# Patient Record
Sex: Female | Born: 1965 | Race: White | Hispanic: Yes | Marital: Married | Smoking: Never smoker
Health system: Southern US, Community
[De-identification: ages and names within clinical notes are randomized; demographics above are authoritative.]

## PROBLEM LIST (undated history)

## (undated) ENCOUNTER — Ambulatory Visit: Discharge status: Absent without leave | Payer: BC Managed Care – PPO | Source: Home / Self Care

## (undated) DIAGNOSIS — M1990 Unspecified osteoarthritis, unspecified site: Secondary | ICD-10-CM

## (undated) DIAGNOSIS — G8929 Other chronic pain: Secondary | ICD-10-CM

## (undated) DIAGNOSIS — M549 Dorsalgia, unspecified: Secondary | ICD-10-CM

## (undated) DIAGNOSIS — N920 Excessive and frequent menstruation with regular cycle: Principal | ICD-10-CM

## (undated) HISTORY — DX: Excessive and frequent menstruation with regular cycle: N92.0

## (undated) HISTORY — DX: Other chronic pain: G89.29

## (undated) HISTORY — DX: Unspecified osteoarthritis, unspecified site: M19.90

## (undated) HISTORY — PX: TUBAL LIGATION: SHX77

## (undated) HISTORY — DX: Dorsalgia, unspecified: M54.9

---

## 2000-10-11 ENCOUNTER — Other Ambulatory Visit: Admission: RE | Admit: 2000-10-11 | Discharge: 2000-10-11 | Payer: Self-pay | Admitting: Obstetrics and Gynecology

## 2003-02-13 ENCOUNTER — Ambulatory Visit (HOSPITAL_COMMUNITY): Admission: RE | Admit: 2003-02-13 | Discharge: 2003-02-13 | Payer: Self-pay | Admitting: Preventative Medicine

## 2003-02-13 ENCOUNTER — Encounter: Payer: Self-pay | Admitting: Preventative Medicine

## 2003-07-21 ENCOUNTER — Ambulatory Visit (HOSPITAL_COMMUNITY): Admission: RE | Admit: 2003-07-21 | Discharge: 2003-07-21 | Payer: Self-pay | Admitting: Unknown Physician Specialty

## 2003-10-06 ENCOUNTER — Encounter
Admission: RE | Admit: 2003-10-06 | Discharge: 2003-12-22 | Payer: Self-pay | Admitting: Physical Medicine and Rehabilitation

## 2007-09-03 ENCOUNTER — Other Ambulatory Visit: Admission: RE | Admit: 2007-09-03 | Discharge: 2007-09-03 | Payer: Self-pay | Admitting: Obstetrics and Gynecology

## 2007-09-21 ENCOUNTER — Emergency Department (HOSPITAL_COMMUNITY): Admission: EM | Admit: 2007-09-21 | Discharge: 2007-09-21 | Payer: Self-pay | Admitting: Emergency Medicine

## 2008-03-04 ENCOUNTER — Inpatient Hospital Stay (HOSPITAL_COMMUNITY): Admission: AD | Admit: 2008-03-04 | Discharge: 2008-03-07 | Payer: Self-pay | Admitting: Obstetrics & Gynecology

## 2008-03-04 ENCOUNTER — Ambulatory Visit: Payer: Self-pay | Admitting: Obstetrics & Gynecology

## 2008-03-05 ENCOUNTER — Encounter: Payer: Self-pay | Admitting: Obstetrics & Gynecology

## 2008-03-21 ENCOUNTER — Ambulatory Visit: Payer: Self-pay | Admitting: Family

## 2008-03-21 ENCOUNTER — Inpatient Hospital Stay (HOSPITAL_COMMUNITY): Admission: AD | Admit: 2008-03-21 | Discharge: 2008-03-21 | Payer: Self-pay | Admitting: Obstetrics & Gynecology

## 2008-04-25 DIAGNOSIS — G8929 Other chronic pain: Secondary | ICD-10-CM

## 2008-04-25 HISTORY — DX: Other chronic pain: G89.29

## 2010-05-15 ENCOUNTER — Encounter: Payer: Self-pay | Admitting: Urology

## 2010-09-07 NOTE — Op Note (Signed)
Katherine Riddle, Katherine Riddle                ACCOUNT NO.:  [id number]   MEDICAL RECORD NO.:  [id number]          PATIENT TYPE:  INP   LOCATION:  9102                          FACILITY:  [facility]   PHYSICIAN:  Norton Blizzard, MD    DATE OF BIRTH:  1965-06-27   DATE OF PROCEDURE:  03/05/2008  DATE OF DISCHARGE:                               OPERATIVE REPORT   PREOPERATIVE DIAGNOSES:  1. Intrauterine pregnancy at 41-2/7 weeks' gestation.  2. Failed induction of labor for post dates.  3. Failure of cervical dilatation.  4. Nonreassuring fetal heart tracing.   POSTOPERATIVE DIAGNOSES:  1. Intrauterine pregnancy at 41-2/7 weeks' gestation.  2. Failed induction of labor for post dates.  3. Failure of cervical dilatation.  4. Nonreassuring fetal heart tracing.   PROCEDURES:  Primary low transverse cesarean section and bilateral tubal  ligation in modified Pomeroy fashion via Pfannenstiel incision.   SURGEON:  Norton Blizzard, MD   ANESTHESIA:  Epidural.   INDICATIONS:  The patient is a 45 year old gravida 7, para 6-0-0-6 who  was admitted for induction of labor for post dates.  Her induction  started with a Foley bulb insertion in the office, and when she came to  labor and delivery, she was started on Pitocin.  The patient got to a  cervical dilatation of 6 cm and failed to progress from the cervical  dilatation for over 3 hours despite adequate contractions as evidenced  by an intrauterine pressure catheter.  The patient was also noted to  have repetitive deep variable decelerations going from a baseline of  130s to the 70s and lasting for 30-45 seconds, which did not resolve  with oxygen, position, and the infusion.  Given this scenario, the  patient was counseled regarding the need for cesarean section.  The  risks of bleeding which might require transfusion, infection which might  require antibiotics, injury to surrounding organs which might require  additional procedures and the risk of  hysterectomy in the event of a  life-threatening bleed were discussed with the patient.  The patient  also desires sterilization.  The risk of failure of tubal sterilization  of 5-7 in 1000 cases with an increased risk of ectopic gestation if  pregnancy occurs was also discussed with the patient.  A written  informed consent was obtained.   FINDINGS:  Viable female infant in cephalic presentation, left occiput  posterior position.  Significant caput.  Apgars were 8 and 9.  Weight 7  pounds 3 ounces.  There was intact placental delivery with 3-vessel cord  noted.  Normal uterus, fallopian tubes, and ovaries bilaterally.   SPECIMENS:  Placenta and cord blood, which were sent to L and D.   IV FLUIDS:  1700 mL of lactated Ringer's.   SPECIMEN:  1 L.   URINE OUTPUT:  130 mL.   COMPLICATIONS:  None.   PROCEDURE DETAILS:  The patient received preoperative IV antibiotics  prior to the incision.  When her epidural level was noted to be at a  surgical level, she was placed in a dorsal supine position with a  leftward tilt and prepped and draped in a sterile manner.  A  Pfannenstiel incision was made with a scalpel and carried through to the  underlying layer of fascia.  Of note, the patient was noted to have a  lot of vessels in subcutaneous tissues, which had some bleeding that was  controlled using coagulation.  An incision was made into the fascia and  the fascial incision was extended bilaterally using Mayo scissors.  Kochers were then applied to the superior aspect of the fascial  incision, and the underlying rectus muscles were dissected off bluntly.  A similar process was carried out on the inferior aspect.  The  peritoneum was entered bluntly, and this incision was extended  superiorly and inferiorly with good visualization of bladder and bowel.  Attention was then turned to the lower uterine segment where a bladder  flap was created and a transverse hysterotomy was made with a  scalpel  and extended bilaterally in a blunt fashion.  The infant's head was  encountered and delivered atraumatically.  The mouth and nares were bulb  suctioned, the rest of the infant was delivered and the cord was clamped  and cut.  The infant was handed over to the awaiting neonatologists.  Cord blood was collected according to protocol.  Fundal massage was  administered and placenta delivered intact with 3-vessel cord.  The  patient was noted to have a lot of bleeding from the hysterotomy site,  especially from the middle of the hysterotomy, and so the uterus was  quickly cleared of all clot and debris and the hysterotomy repaired with  0 Monocryl in a running interlocking stitch.  A second layer of the same  suture was used to imbricate and suture.  Overall, good hemostasis was  noted.  Attention was then turned to the patient's left fallopian tube,  which was grabbed in the mid isthmic region with a Babcock clamp and a  hole was made in the underlying clear mesosalpinx using electrocautery.  About a 1 cm segment of tube was isolated and was tied at both ends  using 2-0 chromic gut.  Each end was tied twice and the intervening  segment was cut out.  This process was repeated on the contralateral  side allowing for bilateral tubal sterilization.  Overall, good  hemostasis was noted.  The hysterotomy was reexamined and found to have  a small amount of bleeding at the middle part of the incision and figure-  of-eight stitch was used to obtain further hemostasis.  The pelvic  gutters were cleared of all clot and debris and hemostasis was  reconfirmed on all surfaces.  The fascia was then reapproximated using 0  Vicryl in a running fashion.  The subcutaneous layer was irrigated with  normal saline and a few areas of bleeding were controlled using  coagulation.  The skin was then closed with staples.  The patient  tolerated the procedure well.  Sponge, instrument, and needle counts  were  correct x2.  She was taken to recovery room awake and in stable  condition.      Norton Blizzard, MD  Electronically Signed     UAD/MEDQ  D:  03/05/2008  T:  03/05/2008  Job:  [id number]

## 2010-09-07 NOTE — Discharge Summary (Signed)
Katherine Riddle, Katherine Riddle                ACCOUNT NO.:  [id number]   MEDICAL RECORD NO.:  [id number]          PATIENT TYPE:  INP   LOCATION:  9102                          FACILITY:  [facility]   PHYSICIAN:  Allie Bossier, MD        DATE OF BIRTH:  April 05, 1966   DATE OF ADMISSION:  03/04/2008  DATE OF DISCHARGE:  03/07/2008                               DISCHARGE SUMMARY   DISCHARGE DIAGNOSES:  1. Status post primary low-transverse cesarean section secondary to      nonreassuring fetal heart tones and failure to progress.  2. Single intrauterine pregnancy at 41.[redacted] weeks gestation.  3. Status post bilateral tubal ligation.   PROCEDURE:  Primary low-transverse cesarean section and bilateral tubal  ligation via Pfannenstiel incision, tubal ligation in a modified Pomeroy  fashion.   HOSPITAL COURSE:  A 45 year old G7, P 6-0-0-6 at 41.[redacted] weeks gestation  presented for induction of labor for postdates.  Induction of labor  consisted of Foley bulb, and Pitocin.  The patient dilated to 6 cm,  however, failed to progress after 3 hours of adequate contractions.  Subsequently had repetitive variables with bradycardia to the 70s that  lasted up to 45 seconds.  The patient was consented for cesarean section  during stage I of labor.  The patient went on to cesarean section and  delivered a viable female with Apgars 8 at 1 minute and 9 at 5 minutes.  Three-vessel cord placenta delivered spontaneously.  The patient  tolerated the procedure well.  The patient also had bilateral tubal  ligation during cesarean section as she desired sterility.  Estimated  blood loss was approximately 1000 mL.  The patient claims to breast  feed.  Status post cesarean section, the patient has postoperative  anemia, hemoglobin of 8.9 and hematocrit of 27.3.  The patient was  without headache, shortness of breath, chest pain, dizziness, or  lightheadedness prior to discharge.  Patient was treated with  supplemental iron sulfate for  postpartum anemia.   ADMISSION LABORATORY DATA:  Blood type O positive, antibody negative,  HIV negative, RPR negative, hepatitis B surface antigen negative,  rubella immune, and GBS negative.  Hemoglobin on admission 12.2 and  hematocrit 37.7.   DISCHARGE LABORATORY DATA:  Hemoglobin 8.9 and hematocrit 27.3.   DISCHARGE MEDICATIONS:  1. Percocet 5/325 mg 1 tablet p.o. q.4 h. p.r.n. pain.  2. Motrin 600 mg one p.o. q.6h. p.r.n. pain.  3. Iron sulfate 325 mg 1 p.o. b.i.d.  4. Colace 100 mg 1 p.o. b.i.d. p.r.n. constipation.  5. Prenatal vitamin daily.   DISCHARGE INSTRUCTIONS:  1. The patient is to have pelvic rest x6 weeks, no heavy lifting x6      weeks.  2. The patient is to have regular diet.  3. The patient will follow up with [facility] on Monday March 10, 2008, to have staples removed.   The patient will follow up in 4 weeks after routine postpartum visit.   DISCHARGE CONDITION:  Stable.      Milinda Antis, MD  Electronically Signed  ______________________________  Allie Bossier, MD    KD/MEDQ  D:  03/08/2008  T:  03/09/2008  Job:  [id number]

## 2011-01-25 LAB — CBC
HCT: 27.3 — ABNORMAL LOW
HCT: 37.7
Hemoglobin: 12.2
Hemoglobin: 8.9 — ABNORMAL LOW
MCHC: 32.4
MCV: 85.7
MCV: 87.1
Platelets: 182
Platelets: 236
RBC: 3.13 — ABNORMAL LOW
RBC: 3.55 — ABNORMAL LOW
RBC: 4.4
RDW: 14.9
WBC: 10.6 — ABNORMAL HIGH
WBC: 12.3 — ABNORMAL HIGH
WBC: 14.1 — ABNORMAL HIGH

## 2011-01-25 LAB — RPR: RPR Ser Ql: NONREACTIVE

## 2012-09-27 ENCOUNTER — Encounter: Payer: Self-pay | Admitting: Internal Medicine

## 2012-09-27 ENCOUNTER — Ambulatory Visit (INDEPENDENT_AMBULATORY_CARE_PROVIDER_SITE_OTHER): Payer: BC Managed Care – PPO | Admitting: Internal Medicine

## 2012-09-27 VITALS — BP 104/78 | HR 86 | Temp 98.4°F | Resp 16 | Wt 193.8 lb

## 2012-09-27 DIAGNOSIS — IMO0002 Reserved for concepts with insufficient information to code with codable children: Secondary | ICD-10-CM

## 2012-09-27 DIAGNOSIS — M1710 Unilateral primary osteoarthritis, unspecified knee: Secondary | ICD-10-CM | POA: Insufficient documentation

## 2012-09-27 DIAGNOSIS — M2390 Unspecified internal derangement of unspecified knee: Secondary | ICD-10-CM | POA: Insufficient documentation

## 2012-09-27 MED ORDER — FLAVOCOXID-CIT ZN BISGLCINATE 500-50 MG PO CAPS: 1.0000 | ORAL_CAPSULE | Freq: Two times a day (BID) | ORAL | Status: DC

## 2012-09-27 NOTE — Patient Instructions (Signed)

## 2012-09-27 NOTE — Progress Notes (Signed)
  Subjective:    Patient ID: Katherine Riddle, female    DOB: 12-Jun-1965, 47 y.o.   MRN: [id number]  Arthritis Presents for follow-up visit. She complains of pain. She reports no stiffness, joint swelling or joint warmth. The symptoms have been worsening. Affected locations include the right knee. Her pain is at a severity of 2/10. Pertinent negatives include no diarrhea, dry eyes, dry mouth, dysuria, fatigue, fever, pain at night, pain while resting, rash, Raynaud's syndrome, uveitis or weight loss. Compliance with total regimen is 0-25%. Compliance with medications is 0-25%. Side effects of treatment include GI discomfort.      Review of Systems  Constitutional: Negative.  Negative for fever, weight loss and fatigue.  HENT: Negative.   Eyes: Negative.   Respiratory: Negative.   Cardiovascular: Negative.   Gastrointestinal: Negative.  Negative for diarrhea.  Endocrine: Negative.   Genitourinary: Negative.  Negative for dysuria.  Musculoskeletal: Positive for back pain, arthralgias and arthritis. Negative for myalgias, joint swelling, gait problem and stiffness.  Skin: Negative for rash.  Allergic/Immunologic: Negative.   Neurological: Negative.   Hematological: Negative for adenopathy. Does not bruise/bleed easily.  Psychiatric/Behavioral: Negative.        Objective:   Physical Exam  Vitals reviewed. Constitutional: She is oriented to person, place, and time. She appears well-developed and well-nourished. No distress.  HENT:  Head: Normocephalic and atraumatic.  Mouth/Throat: Oropharynx is clear and moist. No oropharyngeal exudate.  Eyes: Conjunctivae are normal. Right eye exhibits no discharge. Left eye exhibits no discharge. No scleral icterus.  Neck: Normal range of motion. Neck supple. No JVD present. No tracheal deviation present. No thyromegaly present.  Cardiovascular: Normal rate, regular rhythm, normal heart sounds and intact distal pulses.  Exam reveals no gallop and no  friction rub.   No murmur heard. Pulmonary/Chest: Effort normal and breath sounds normal. No stridor. No respiratory distress. She has no wheezes. She has no rales. She exhibits no tenderness.  Abdominal: Soft. Bowel sounds are normal. She exhibits no distension and no mass. There is no tenderness. There is no rebound and no guarding.  Musculoskeletal: Normal range of motion. She exhibits no edema and no tenderness.       Right knee: She exhibits normal range of motion, no swelling, no effusion, no ecchymosis, no deformity, no laceration, no erythema, normal alignment, no LCL laxity, normal patellar mobility and no bony tenderness. Tenderness found. Medial joint line tenderness noted. No lateral joint line, no MCL, no LCL and no patellar tendon tenderness noted.       Left knee: Normal.  Lymphadenopathy:    She has no cervical adenopathy.  Neurological: She is oriented to person, place, and time.  Skin: Skin is warm and dry. No rash noted. She is not diaphoretic. No erythema. No pallor.  Psychiatric: She has a normal mood and affect. Her behavior is normal. Judgment and thought content normal.          Assessment & Plan:

## 2012-09-27 NOTE — Assessment & Plan Note (Signed)
There is some concern about the medial meniscus so I have asked her to see ortho about this

## 2012-09-27 NOTE — Assessment & Plan Note (Signed)
She has tried nsaids with no relief She did not tolerate tramadol She was referred for PT but she refuses to do it I have asked her to try limbrel

## 2012-11-20 ENCOUNTER — Ambulatory Visit: Payer: Self-pay | Admitting: Internal Medicine

## 2013-04-08 ENCOUNTER — Encounter: Payer: Self-pay | Admitting: Adult Health

## 2013-04-08 ENCOUNTER — Other Ambulatory Visit: Payer: Self-pay | Admitting: Adult Health

## 2013-04-08 ENCOUNTER — Ambulatory Visit (INDEPENDENT_AMBULATORY_CARE_PROVIDER_SITE_OTHER): Payer: Self-pay | Admitting: Adult Health

## 2013-04-08 ENCOUNTER — Other Ambulatory Visit (INDEPENDENT_AMBULATORY_CARE_PROVIDER_SITE_OTHER): Payer: Self-pay

## 2013-04-08 VITALS — BP 110/70 | Ht 62.0 in | Wt 189.0 lb

## 2013-04-08 DIAGNOSIS — N920 Excessive and frequent menstruation with regular cycle: Secondary | ICD-10-CM

## 2013-04-08 DIAGNOSIS — N949 Unspecified condition associated with female genital organs and menstrual cycle: Secondary | ICD-10-CM

## 2013-04-08 DIAGNOSIS — N926 Irregular menstruation, unspecified: Secondary | ICD-10-CM

## 2013-04-08 DIAGNOSIS — N939 Abnormal uterine and vaginal bleeding, unspecified: Secondary | ICD-10-CM

## 2013-04-08 HISTORY — DX: Excessive and frequent menstruation with regular cycle: N92.0

## 2013-04-08 MED ORDER — MEGESTROL ACETATE 40 MG PO TABS: ORAL_TABLET | ORAL | Status: DC

## 2013-04-08 MED ORDER — IBUPROFEN 800 MG PO TABS: 800.0000 mg | ORAL_TABLET | Freq: Three times a day (TID) | ORAL | Status: DC | PRN

## 2013-04-08 NOTE — Progress Notes (Signed)
Subjective:     Patient ID: ROCHELL PUETT, female   DOB: 04/14/1966, 47 y.o.   MRN: [id number]  HPI Kattleya is a 47 year old Hispanic female married in complaining of bleeding since mid October and some cramps and aches.Had [place] this am.  Review of Systems See HPI Reviewed past medical,surgical, social and family history. Reviewed medications and allergies.     Objective:   Physical Exam BP 110/70  Ht 5\' 2"  (1.575 m)  Wt 189 lb (85.73 kg)  BMI 34.56 kg/m2  LMP 02/06/2013   reviewed US uterus 11.9 x 8.6 x 6.8 without masses, endometrium 31.1 mm and active bleeding,rt ovary 2.9 x 1.8 x 1.4 cm with paratubal cyst, left ovary 3 x 2.2 x 1.7, discussed with Dr Despina Hidden, will try megace, pt declines exam to day will do at follow up  Assessment:     Menorrhagia AUB    Plan:     Rx megace 40 mg #45 3 x 5 days then 2 x 5 days then 1 daily with 1 refill   Follow up in 4 weeks Rx motrin 800 mg 1 every 8 hours #60 with 1 refill Review handout on menorrhagia

## 2013-04-08 NOTE — Patient Instructions (Signed)
Menorrhagia Dysfunctional uterine bleeding is different from a normal menstrual period. When periods are heavy or there is more bleeding than is usual for you, it is called menorrhagia. It may be caused by hormonal imbalance, or physical, metabolic, or other problems. Examination is necessary in order that your caregiver may treat treatable causes. If this is a continuing problem, a D&C may be needed. That means that the cervix (the opening of the uterus or womb) is dilated (stretched larger) and the lining of the uterus is scraped out. The tissue scraped out is then examined under a microscope by a specialist (pathologist) to make sure there is nothing of concern that needs further or more extensive treatment. HOME CARE INSTRUCTIONS   If medications were prescribed, take exactly as directed. Do not change or switch medications without consulting your caregiver.  Long term heavy bleeding may result in iron deficiency. Your caregiver may have prescribed iron pills. They help replace the iron your body lost from heavy bleeding. Take exactly as directed. Iron may cause constipation. If this becomes a problem, increase the bran, fruits, and roughage in your diet.  Do not take aspirin or medicines that contain aspirin one week before or during your menstrual period. Aspirin may make the bleeding worse.  If you need to change your sanitary pad or tampon more than once every 2 hours, stay in bed and rest as much as possible until the bleeding stops.  Eat well-balanced meals. Eat foods high in iron. Examples are leafy green vegetables, meat, liver, eggs, and whole grain breads and cereals. Do not try to lose weight until the abnormal bleeding has stopped and your blood iron level is back to normal. SEEK MEDICAL CARE IF:   You need to change your sanitary pad or tampon more than once an hour.  You develop nausea (feeling sick to your stomach) and vomiting, dizziness, or diarrhea while you are taking your  medicine.  You have any problems that may be related to the medicine you are taking. SEEK IMMEDIATE MEDICAL CARE IF:   You have a fever.  You develop chills.  You develop severe bleeding or start to pass blood clots.  You feel dizzy or faint. MAKE SURE YOU:   Understand these instructions.  Will watch your condition.  Will get help right away if you are not doing well or get worse. Document Released: 04/11/2005 Document Revised: 07/04/2011 Document Reviewed: 09/30/2012 [facility] Patient Information 2014 [place], [place]. Take megace Follow up in 4 weeks

## 2013-05-06 ENCOUNTER — Ambulatory Visit: Payer: Self-pay | Admitting: Adult Health

## 2013-06-13 ENCOUNTER — Telehealth: Payer: Self-pay | Admitting: Adult Health

## 2013-06-13 NOTE — Telephone Encounter (Signed)
Her daughter Corrie DandyMary called to talk for [place], took megace and bleeding better went to [place] and they told her needed to have surgery, told pt to make appt with Dr Emelda Fearferguson for endo biopsy may not need surgery.

## 2014-02-24 ENCOUNTER — Encounter: Payer: Self-pay | Admitting: Adult Health

## 2014-11-05 ENCOUNTER — Ambulatory Visit (HOSPITAL_COMMUNITY)
Admission: RE | Admit: 2014-11-05 | Discharge: 2014-11-05 | Disposition: A | Discharge status: Home / Self Care | Payer: BLUE CROSS/BLUE SHIELD | Source: Ambulatory Visit | Attending: Family Medicine | Admitting: Family Medicine

## 2014-11-05 ENCOUNTER — Other Ambulatory Visit (HOSPITAL_COMMUNITY): Payer: Self-pay | Admitting: Family Medicine

## 2014-11-05 DIAGNOSIS — M542 Cervicalgia: Secondary | ICD-10-CM | POA: Insufficient documentation

## 2014-11-05 DIAGNOSIS — M25512 Pain in left shoulder: Secondary | ICD-10-CM

## 2014-11-05 DIAGNOSIS — R200 Anesthesia of skin: Secondary | ICD-10-CM | POA: Diagnosis not present

## 2014-11-05 DIAGNOSIS — R531 Weakness: Secondary | ICD-10-CM | POA: Diagnosis not present

## 2017-02-26 ENCOUNTER — Encounter (HOSPITAL_COMMUNITY): Payer: Self-pay

## 2017-02-26 ENCOUNTER — Emergency Department (HOSPITAL_COMMUNITY)
Admission: EM | Admit: 2017-02-26 | Discharge: 2017-02-26 | Disposition: A | Discharge status: Home Health | Payer: BLUE CROSS/BLUE SHIELD | Attending: Emergency Medicine | Admitting: Emergency Medicine

## 2017-02-26 ENCOUNTER — Other Ambulatory Visit: Payer: Self-pay

## 2017-02-26 DIAGNOSIS — J020 Streptococcal pharyngitis: Secondary | ICD-10-CM | POA: Diagnosis not present

## 2017-02-26 DIAGNOSIS — Z79899 Other long term (current) drug therapy: Secondary | ICD-10-CM | POA: Insufficient documentation

## 2017-02-26 DIAGNOSIS — R000 Tachycardia, unspecified: Secondary | ICD-10-CM | POA: Insufficient documentation

## 2017-02-26 DIAGNOSIS — J029 Acute pharyngitis, unspecified: Secondary | ICD-10-CM | POA: Diagnosis not present

## 2017-02-26 DIAGNOSIS — R1110 Vomiting, unspecified: Secondary | ICD-10-CM | POA: Diagnosis present

## 2017-02-26 MED ORDER — HYDROCODONE-ACETAMINOPHEN 5-325 MG PO TABS: 1.0000 | ORAL_TABLET | ORAL | 0 refills | Status: DC | PRN

## 2017-02-26 MED ORDER — ACETAMINOPHEN 325 MG PO TABS
650.0000 mg | ORAL_TABLET | Freq: Once | ORAL | Status: AC
  Administered 2017-02-26: 650 mg via ORAL
  Filled 2017-02-26: qty 2

## 2017-02-26 MED ORDER — HYDROCODONE-ACETAMINOPHEN 5-325 MG PO TABS: 2.0000 | ORAL_TABLET | ORAL | 0 refills | Status: DC | PRN

## 2017-02-26 MED ORDER — SODIUM CHLORIDE 0.9 % IV BOLUS (SEPSIS)
1000.0000 mL | Freq: Once | INTRAVENOUS | Status: AC
  Administered 2017-02-26: 1000 mL via INTRAVENOUS

## 2017-02-26 MED ORDER — PREDNISONE 20 MG PO TABS: 20.0000 mg | ORAL_TABLET | Freq: Two times a day (BID) | ORAL | 0 refills | Status: DC

## 2017-02-26 MED ORDER — PENICILLIN G BENZATHINE 1200000 UNIT/2ML IM SUSP
1.2000 10*6.[IU] | Freq: Once | INTRAMUSCULAR | Status: AC
  Administered 2017-02-26: 1.2 10*6.[IU] via INTRAMUSCULAR
  Filled 2017-02-26: qty 2

## 2017-02-26 MED ORDER — KETOROLAC TROMETHAMINE 30 MG/ML IJ SOLN
30.0000 mg | Freq: Once | INTRAMUSCULAR | Status: AC
  Administered 2017-02-26: 30 mg via INTRAVENOUS
  Filled 2017-02-26: qty 1

## 2017-02-26 MED ORDER — DEXAMETHASONE SODIUM PHOSPHATE 10 MG/ML IJ SOLN
10.0000 mg | Freq: Once | INTRAMUSCULAR | Status: AC
  Administered 2017-02-26: 10 mg via INTRAVENOUS
  Filled 2017-02-26: qty 1

## 2017-02-26 NOTE — ED Triage Notes (Signed)
Was seen at PCP yesterday for strep throat. States she was given Tramadol for pain and she is allergic to medication. Reports of vomiting after taking medication.

## 2017-02-26 NOTE — Discharge Instructions (Signed)
Prednisone steroid for next 2 days for pain and swelling. Vicodin for pain. You do not need to take amoxicillin. You were given a shot of penicillin today that will last for 10 days. Return to ER with any worsening difficulty breathing, swallowing, or other symptoms.

## 2017-02-26 NOTE — ED Provider Notes (Signed)
[facility] EMERGENCY DEPARTMENT Provider Note   CSN: [id number] Arrival date & time: 02/26/17  1240     History   Chief Complaint Chief Complaint  Patient presents with  . Emesis    HPI Katherine Riddle is a 51 y.o. female. Chief complaint is sore throat, vomiting after medicines, high heart rate  HPI:  51 y/o female. Seen and diagnosed yesterday with strep throat. Given amoxicillin, tramadol, and discharge. 2 tramadol today. Start having nausea and vomiting. She did not know the tramadol and Ultram with the same, and thus did not realize she was taking a medicine she was known to be allergic to. Her side effect is vomiting. She presents here with a temperature of 100.5. Complaining of sore throat. She can swallow though it is painful. An sensation of her heart "beating fast".  Past Medical History:  Diagnosis Date  . Arthritis   . Chronic back pain 2010  . Menorrhagia 04/08/2013    Patient Active Problem List   Diagnosis Date Noted  . Menorrhagia 04/08/2013  . Unspecified internal derangement of knee 09/27/2012  . DJD (degenerative joint disease) of knee 09/27/2012    Past Surgical History:  Procedure Laterality Date  . CESAREAN SECTION    . TUBAL LIGATION      OB History    Gravida Para Term Preterm AB Living   7 7       7    SAB TAB Ectopic Multiple Live Births           7       Home Medications    Prior to Admission medications   Medication Sig Start Date End Date Taking? Authorizing Provider  amoxicillin (AMOXIL) 500 MG capsule Take 1,000 mg 2 (two) times daily by mouth.   Yes [provider]  cyclobenzaprine (FLEXERIL) 10 MG tablet Take 10 mg 3 (three) times daily as needed by mouth for muscle spasms.   Yes [provider]  guaifenesin (ROBITUSSIN) 100 MG/5ML syrup Take 100 mg 3 (three) times daily as needed by mouth for cough.   Yes [provider]  ibuprofen (ADVIL,MOTRIN) 200 MG tablet Take 400 mg every 6 (six) hours as needed  by mouth for fever.   Yes [provider]  traMADol (ULTRAM) 50 MG tablet Take 50-100 mg every 6 (six) hours as needed by mouth for moderate pain.   Yes [provider]  HYDROcodone-acetaminophen (NORCO/VICODIN) 5-325 MG tablet Take 1 tablet every 4 (four) hours as needed by mouth. 02/26/17   Rolland Porter, MD  predniSONE (DELTASONE) 20 MG tablet Take 1 tablet (20 mg total) 2 (two) times daily with a meal by mouth. 02/26/17   Rolland Porter, MD    Family History Family History  Problem Relation Age of Onset  . Arthritis Mother   . Prostate cancer Father   . Hyperlipidemia Father   . Stroke Father   . Diabetes Father   . Early death Neg Hx   . Hypertension Neg Hx   . Kidney disease Neg Hx     Social History Social History   Tobacco Use  . Smoking status: Never Smoker  . Smokeless tobacco: Never Used  Substance Use Topics  . Alcohol use: No  . Drug use: No     Allergies   Tramadol   Review of Systems Review of Systems  Constitutional: Negative for appetite change, chills, diaphoresis, fatigue and fever.  HENT: Positive for sore throat, trouble swallowing and voice change. Negative for mouth  sores.   Eyes: Negative for visual disturbance.  Respiratory: Negative for cough, chest tightness, shortness of breath and wheezing.   Cardiovascular: Positive for palpitations. Negative for chest pain.  Gastrointestinal: Negative for abdominal distention, abdominal pain, diarrhea, nausea and vomiting.  Endocrine: Negative for polydipsia, polyphagia and polyuria.  Genitourinary: Negative for dysuria, frequency and hematuria.  Musculoskeletal: Negative for gait problem.  Skin: Negative for color change, pallor and rash.  Neurological: Negative for dizziness, syncope, light-headedness and headaches.  Hematological: Does not bruise/bleed easily.  Psychiatric/Behavioral: Negative for behavioral problems and confusion.     Physical Exam Updated Vital Signs BP 103/80    Pulse (S) (!) 149   Temp 99.4 F (37.4 C) (Oral)   Resp 18   Ht 5\' 2"  (1.575 m)   Wt 87.1 kg (192 lb)   LMP 02/06/2013   SpO2 97%   BMI 35.12 kg/m   Physical Exam  Constitutional: She is oriented to person, place, and time. She appears well-developed and well-nourished. No distress.  HENT:  Head: Normocephalic.  51 year old female. No distress. Ambulatory. Voice. No stridor. Can lay supine without apprehension. Posterior pharynx erythematous with white exudate. No asymmetry or sign of abscess. Tender enlarged anterior cervical adenopathy. No posterior cervical nodes.  Eyes: Conjunctivae are normal. Pupils are equal, round, and reactive to light. No scleral icterus.  Neck: Normal range of motion. Neck supple. No thyromegaly present.  Cardiovascular: Normal rate and regular rhythm. Exam reveals no gallop and no friction rub.  No murmur heard. His tachycardia 120-140.  Pulmonary/Chest: Effort normal and breath sounds normal. No respiratory distress. She has no wheezes. She has no rales.  Abdominal: Soft. Bowel sounds are normal. She exhibits no distension. There is no tenderness. There is no rebound.  Musculoskeletal: Normal range of motion.  Neurological: She is alert and oriented to person, place, and time.  Skin: Skin is warm and dry. No rash noted.  Psychiatric: She has a normal mood and affect. Her behavior is normal.     ED Treatments / Results  Labs (all labs ordered are listed, but only abnormal results are displayed) Labs Reviewed - No data to display  EKG  EKG Interpretation  Date/Time:  Sunday February 26 2017 13:54:51 EST Ventricular Rate:  124 PR Interval:    QRS Duration: 66 QT Interval:  317 QTC Calculation: 456 R Axis:   78 Text Interpretation:  Sinus tachycardia Confirmed by Rolland Porter ([id number]) on 02/26/2017 1:58:00 PM       Radiology No results found.  Procedures Procedures (including critical care time)  Medications Ordered in ED Medications    penicillin g benzathine (BICILLIN LA) 1200000 UNIT/2ML injection 1.2 Million Units (not administered)  sodium chloride 0.9 % bolus 1,000 mL (not administered)  sodium chloride 0.9 % bolus 1,000 mL (1,000 mLs Intravenous New Bag/Given 02/26/17 1349)  dexamethasone (DECADRON) injection 10 mg (10 mg Intravenous Given 02/26/17 1349)  ketorolac (TORADOL) 30 MG/ML injection 30 mg (30 mg Intravenous Given 02/26/17 1348)  acetaminophen (TYLENOL) tablet 650 mg (650 mg Oral Given 02/26/17 1349)     Initial Impression / Assessment and Plan / ED Course  I have reviewed the triage vital signs and the nursing notes.  Pertinent labs & imaging results that were available during my care of the patient were reviewed by me and considered in my medical decision making (see chart for details).   to 100.5. Tachycardic. Plan IV fluids, Decadron, Toradol. EKG confirms sinus tachycardia. No dysrhythmia simply tachycardia.Patient prefers treatment with  IM versus by mouth meds. We'll plan Bicillin IM  Final Clinical Impressions(s) / ED Diagnoses   Final diagnoses:  Pharyngitis due to Streptococcus species   On recheck patient her rate improved. Given additional liter fluid. Had IM Bicillin. Laying supine. Not apprehensive. Eating drinking swallowing. Plan is discharge home. 2 days prednisone. Vicodin as needed. Return if any new or worsening symptoms.   New Prescriptions This SmartLink is deprecated. Use AVSMEDLIST instead to display the medication list for a patient.   Rolland PorterJames, Shakthi Scipio, MD 02/26/17 [phone]

## 2018-10-30 ENCOUNTER — Encounter (HOSPITAL_COMMUNITY): Payer: Self-pay | Admitting: Emergency Medicine

## 2018-10-30 ENCOUNTER — Other Ambulatory Visit: Payer: Self-pay

## 2018-10-30 ENCOUNTER — Emergency Department (HOSPITAL_COMMUNITY)
Admission: EM | Admit: 2018-10-30 | Discharge: 2018-10-30 | Disposition: A | Discharge status: Home / Self Care | Payer: BC Managed Care – PPO | Attending: Emergency Medicine | Admitting: Emergency Medicine

## 2018-10-30 DIAGNOSIS — S8991XA Unspecified injury of right lower leg, initial encounter: Secondary | ICD-10-CM | POA: Diagnosis present

## 2018-10-30 DIAGNOSIS — W5501XA Bitten by cat, initial encounter: Secondary | ICD-10-CM | POA: Diagnosis not present

## 2018-10-30 DIAGNOSIS — Y929 Unspecified place or not applicable: Secondary | ICD-10-CM | POA: Diagnosis not present

## 2018-10-30 DIAGNOSIS — Z23 Encounter for immunization: Secondary | ICD-10-CM | POA: Insufficient documentation

## 2018-10-30 DIAGNOSIS — S81831A Puncture wound without foreign body, right lower leg, initial encounter: Secondary | ICD-10-CM | POA: Insufficient documentation

## 2018-10-30 DIAGNOSIS — Y999 Unspecified external cause status: Secondary | ICD-10-CM | POA: Insufficient documentation

## 2018-10-30 DIAGNOSIS — Y939 Activity, unspecified: Secondary | ICD-10-CM | POA: Diagnosis not present

## 2018-10-30 MED ORDER — AMOXICILLIN-POT CLAVULANATE 875-125 MG PO TABS
1.0000 | ORAL_TABLET | Freq: Once | ORAL | Status: AC
  Administered 2018-10-30: 1 via ORAL
  Filled 2018-10-30: qty 1

## 2018-10-30 MED ORDER — TETANUS-DIPHTH-ACELL PERTUSSIS 5-2.5-18.5 LF-MCG/0.5 IM SUSP
0.5000 mL | Freq: Once | INTRAMUSCULAR | Status: AC
  Administered 2018-10-30: 0.5 mL via INTRAMUSCULAR
  Filled 2018-10-30: qty 0.5
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
## 2018-10-30 NOTE — Discharge Instructions (Addendum)
Your tetanus has been updated tonight.  You should complete the entire course of the antibiotics prescribed to help prevent infection given you have deep puncture wounds from this cats teeth.  Since the cat is in the hands of Animal Control and can be tested for rabies, you do not need to start this rabies series today, but you will need to return here if directed to by Animal Control.  You safely have 10 days from yesterday (July 16) to start the rabies series if it is determined you need this.

## 2018-10-30 NOTE — ED Notes (Signed)
Pt states she contacted animal control and was directed by them to come to ED

## 2018-10-31 NOTE — ED Provider Notes (Signed)
[facility] EMERGENCY DEPARTMENT Provider Note   CSN: [id number] Arrival date & time: 10/30/18  1804     History   Chief Complaint Chief Complaint  Patient presents with  . Animal Bite    HPI Katherine Riddle is a 53 y.o. female.     The history is provided by the patient.  Animal Bite Contact animal:  Cat Location:  Leg Leg injury location:  R upper leg Pain details:    Quality:  Aching   Severity:  Mild   Timing:  Constant   Progression:  Unchanged Incident location:  Another residence (she was at a friends home, who was also bit by this cat. The friend was seen here yesterday and rabies series was started as the cat was not captured at the time.  It has since been killed and is the hands of Animal Control) Provoked: unprovoked   Notifications:  Animal control Animal's rabies vaccination status:  Unknown (stray cat) Animal in possession: yes   Tetanus status: 53 years old. Relieved by:  None tried Worsened by:  Nothing Ineffective treatments:  None tried Associated symptoms: no fever, no numbness and no swelling     Past Medical History:  Diagnosis Date  . Arthritis   . Chronic back pain 2010  . Menorrhagia 04/08/2013    Patient Active Problem List   Diagnosis Date Noted  . Menorrhagia 04/08/2013  . Unspecified internal derangement of knee 09/27/2012  . DJD (degenerative joint disease) of knee 09/27/2012    Past Surgical History:  Procedure Laterality Date  . CESAREAN SECTION    . TUBAL LIGATION       OB History    Gravida  7   Para  7   Term      Preterm      AB      Living  7     SAB      TAB      Ectopic      Multiple      Live Births  7            Home Medications    Prior to Admission medications   Medication Sig Start Date End Date Taking? Authorizing Provider  amoxicillin (AMOXIL) 500 MG capsule Take 1,000 mg 2 (two) times daily by mouth.    [provider]  cyclobenzaprine (FLEXERIL) 10 MG tablet Take 10  mg 3 (three) times daily as needed by mouth for muscle spasms.    [provider]  guaifenesin (ROBITUSSIN) 100 MG/5ML syrup Take 100 mg 3 (three) times daily as needed by mouth for cough.    [provider]  HYDROcodone-acetaminophen (NORCO/VICODIN) 5-325 MG tablet Take 2 tablets every 4 (four) hours as needed by mouth. 02/26/17   Tanna Furry, MD  ibuprofen (ADVIL,MOTRIN) 200 MG tablet Take 400 mg every 6 (six) hours as needed by mouth for fever.    [provider]  predniSONE (DELTASONE) 20 MG tablet Take 1 tablet (20 mg total) 2 (two) times daily with a meal by mouth. 02/26/17   Tanna Furry, MD  traMADol (ULTRAM) 50 MG tablet Take 50-100 mg every 6 (six) hours as needed by mouth for moderate pain.    [provider]    Family History Family History  Problem Relation Age of Onset  . Arthritis Mother   . Prostate cancer Father   . Hyperlipidemia Father   . Stroke Father   . Diabetes Father   . Early death Neg  Hx   . Hypertension Neg Hx   . Kidney disease Neg Hx     Social History Social History   Tobacco Use  . Smoking status: Never Smoker  . Smokeless tobacco: Never Used  Substance Use Topics  . Alcohol use: No  . Drug use: No     Allergies   Tramadol   Review of Systems Review of Systems  Constitutional: Negative for chills, diaphoresis and fever.  Respiratory: Negative for shortness of breath.   Cardiovascular: Negative.   Skin: Positive for wound.  Neurological: Negative for numbness.     Physical Exam Updated Vital Signs BP 119/76 (BP Location: Right Arm)   Pulse 96   Temp 98.7 F (37.1 C) (Oral)   Resp 15   Ht 5\' 2"  (1.575 m)   Wt 82.6 kg   LMP 02/06/2013   SpO2 99%   BMI 33.29 kg/m   Physical Exam Constitutional:      Appearance: She is well-developed.  HENT:     Head: Normocephalic.  Cardiovascular:     Rate and Rhythm: Normal rate.  Pulmonary:     Effort: Pulmonary effort is normal.  Musculoskeletal:         General: No tenderness.  Skin:    Comments: 2 small punctures right upper lateral thigh.  No induration or erythema or drainage. Soft to palpation, mildly tender. No red streaking.  Neurological:     Mental Status: She is alert and oriented to person, place, and time.     Sensory: No sensory deficit.      ED Treatments / Results  Labs (all labs ordered are listed, but only abnormal results are displayed) Labs Reviewed - No data to display  EKG None  Radiology No results found.  Procedures Procedures (including critical care time)  Medications Ordered in ED Medications  Tdap (BOOSTRIX) injection 0.5 mL (0.5 mLs Intramuscular Given 10/30/18 2056)  amoxicillin-clavulanate (AUGMENTIN) 875-125 MG per tablet 1 tablet (1 tablet Oral Given 10/30/18 2055)     Initial Impression / Assessment and Plan / ED Course  I have reviewed the triage vital signs and the nursing notes.  Pertinent labs & imaging results that were available during my care of the patient were reviewed by me and considered in my medical decision making (see chart for details).        Stray cat bite with animal now with animal control. Pt's tetanus was updated and she was started on augmentin for infection prophylaxis, no signs of infection at this time.  Discussed need for close f/u with Animal Control who will notify her if she needs to start the rabies series if cat tests positive for this infection.  She knows she has 9 days (occurred ytd) to determine if rabies series needs to be started.  Pt understands and agrees with plan.  Final Clinical Impressions(s) / ED Diagnoses   Final diagnoses:  Cat bite, initial encounter    ED Discharge Orders    None       Victoriano Laindol, Tenley Winward, PA-C 10/31/18 1253    Sabas SousBero, Michael M, MD 11/05/18 [phone]

## 2018-11-01 ENCOUNTER — Other Ambulatory Visit: Payer: Self-pay

## 2018-11-01 ENCOUNTER — Emergency Department (HOSPITAL_COMMUNITY)
Admission: EM | Admit: 2018-11-01 | Discharge: 2018-11-01 | Disposition: A | Discharge status: Home / Self Care | Payer: BC Managed Care – PPO | Attending: Emergency Medicine | Admitting: Emergency Medicine

## 2018-11-01 ENCOUNTER — Encounter (HOSPITAL_COMMUNITY): Payer: Self-pay | Admitting: Emergency Medicine

## 2018-11-01 DIAGNOSIS — S71151A Open bite, right thigh, initial encounter: Secondary | ICD-10-CM | POA: Diagnosis not present

## 2018-11-01 DIAGNOSIS — Z2914 Encounter for prophylactic rabies immune globin: Secondary | ICD-10-CM | POA: Diagnosis not present

## 2018-11-01 DIAGNOSIS — Y999 Unspecified external cause status: Secondary | ICD-10-CM | POA: Insufficient documentation

## 2018-11-01 DIAGNOSIS — W5501XA Bitten by cat, initial encounter: Secondary | ICD-10-CM | POA: Insufficient documentation

## 2018-11-01 DIAGNOSIS — Z203 Contact with and (suspected) exposure to rabies: Secondary | ICD-10-CM | POA: Insufficient documentation

## 2018-11-01 DIAGNOSIS — Y92009 Unspecified place in unspecified non-institutional (private) residence as the place of occurrence of the external cause: Secondary | ICD-10-CM | POA: Insufficient documentation

## 2018-11-01 DIAGNOSIS — Z23 Encounter for immunization: Secondary | ICD-10-CM | POA: Insufficient documentation

## 2018-11-01 DIAGNOSIS — Y939 Activity, unspecified: Secondary | ICD-10-CM | POA: Insufficient documentation

## 2018-11-01 MED ORDER — AMOXICILLIN-POT CLAVULANATE 875-125 MG PO TABS: 1.0000 | ORAL_TABLET | Freq: Two times a day (BID) | ORAL | 0 refills | Status: DC

## 2018-11-01 MED ORDER — RABIES VACCINE, PCEC IM SUSR
1.0000 mL | Freq: Once | INTRAMUSCULAR | Status: AC
  Administered 2018-11-01: 1 mL via INTRAMUSCULAR
  Filled 2018-11-01: qty 1

## 2018-11-01 MED ORDER — ONDANSETRON HCL 4 MG PO TABS
4.0000 mg | ORAL_TABLET | Freq: Once | ORAL | Status: AC
  Administered 2018-11-01: 4 mg via ORAL
  Filled 2018-11-01: qty 1

## 2018-11-01 MED ORDER — RABIES IMMUNE GLOBULIN 150 UNIT/ML IM INJ
20.0000 [IU]/kg | INJECTION | Freq: Once | INTRAMUSCULAR | Status: AC
  Administered 2018-11-01: 1650 [IU] via INTRAMUSCULAR
  Filled 2018-11-01: qty 12

## 2018-11-01 MED ORDER — AMOXICILLIN-POT CLAVULANATE 875-125 MG PO TABS
1.0000 | ORAL_TABLET | Freq: Once | ORAL | Status: AC
  Administered 2018-11-01: 1 via ORAL
  Filled 2018-11-01: qty 1

## 2018-11-01 NOTE — ED Notes (Signed)
Provided pt with rabies vaccine and info for follow up

## 2018-11-01 NOTE — ED Triage Notes (Signed)
Patient was bitten by a cat on Monday. Contacted by animal control today and told the cat was rabid. Here to start rabies injections.

## 2018-11-01 NOTE — ED Provider Notes (Signed)
[facility] EMERGENCY DEPARTMENT Provider Note   CSN: [id number] Arrival date & time: 11/01/18  1645     History   Chief Complaint Chief Complaint  Patient presents with  . Animal Bite    HPI Katherine Riddle is a 53 y.o. female.     Patient is a 53 year old female who presents to the emergency department with complaint of cat bite.  The patient states that about 3 to 4 days ago she was bit by a cat at a friend's home.  She presented to the emergency department on July 7 she was evaluated at that time.  There was no information from animal control, but animal control notified the patient today that the cat was rabid and that she should have the rabies vaccines.  She presents now for those vaccines.  No symptoms are reported.  No signs of advancing infection..  The history is provided by the patient.  Animal Bite Associated symptoms: no numbness and no rash     Past Medical History:  Diagnosis Date  . Arthritis   . Chronic back pain 2010  . Menorrhagia 04/08/2013    Patient Active Problem List   Diagnosis Date Noted  . Menorrhagia 04/08/2013  . Unspecified internal derangement of knee 09/27/2012  . DJD (degenerative joint disease) of knee 09/27/2012    Past Surgical History:  Procedure Laterality Date  . CESAREAN SECTION    . TUBAL LIGATION       OB History    Gravida  7   Para  7   Term      Preterm      AB      Living  7     SAB      TAB      Ectopic      Multiple      Live Births  7            Home Medications    Prior to Admission medications   Medication Sig Start Date End Date Taking? Authorizing Provider  amoxicillin (AMOXIL) 500 MG capsule Take 1,000 mg 2 (two) times daily by mouth.    [provider]  cyclobenzaprine (FLEXERIL) 10 MG tablet Take 10 mg 3 (three) times daily as needed by mouth for muscle spasms.    [provider]  guaifenesin (ROBITUSSIN) 100 MG/5ML syrup Take 100 mg 3 (three) times daily as  needed by mouth for cough.    [provider]  HYDROcodone-acetaminophen (NORCO/VICODIN) 5-325 MG tablet Take 2 tablets every 4 (four) hours as needed by mouth. 02/26/17   Rolland PorterJames, Mark, MD  ibuprofen (ADVIL,MOTRIN) 200 MG tablet Take 400 mg every 6 (six) hours as needed by mouth for fever.    [provider]  predniSONE (DELTASONE) 20 MG tablet Take 1 tablet (20 mg total) 2 (two) times daily with a meal by mouth. 02/26/17   Rolland PorterJames, Mark, MD  traMADol (ULTRAM) 50 MG tablet Take 50-100 mg every 6 (six) hours as needed by mouth for moderate pain.    [provider]    Family History Family History  Problem Relation Age of Onset  . Arthritis Mother   . Prostate cancer Father   . Hyperlipidemia Father   . Stroke Father   . Diabetes Father   . Early death Neg Hx   . Hypertension Neg Hx   . Kidney disease Neg Hx     Social History Social History   Tobacco Use  . Smoking status: Never  Smoker  . Smokeless tobacco: Never Used  Substance Use Topics  . Alcohol use: No  . Drug use: No     Allergies   Tramadol   Review of Systems Review of Systems  Constitutional: Negative for activity change and appetite change.  HENT: Negative for congestion, ear discharge, ear pain, facial swelling, nosebleeds, rhinorrhea, sneezing and tinnitus.   Eyes: Negative for photophobia, pain and discharge.  Respiratory: Negative for cough, choking, shortness of breath and wheezing.   Cardiovascular: Negative for chest pain, palpitations and leg swelling.  Gastrointestinal: Negative for abdominal pain, blood in stool, constipation, diarrhea, nausea and vomiting.  Genitourinary: Negative for difficulty urinating, dysuria, flank pain, frequency and hematuria.  Musculoskeletal: Negative for back pain, gait problem, myalgias and neck pain.  Skin: Negative for color change, rash and wound.  Neurological: Negative for dizziness, seizures, syncope, facial asymmetry, speech difficulty,  weakness and numbness.  Hematological: Negative for adenopathy. Does not bruise/bleed easily.  Psychiatric/Behavioral: Negative for agitation, confusion, hallucinations, self-injury and suicidal ideas. The patient is not nervous/anxious.      Physical Exam Updated Vital Signs BP 121/76 (BP Location: Right Arm)   Pulse (!) 109   Temp 98.8 F (37.1 C) (Oral)   Resp 18   LMP 02/06/2013   SpO2 98%   Physical Exam Vitals signs and nursing note reviewed.  Constitutional:      Appearance: She is well-developed. She is not toxic-appearing.  HENT:     Head: Normocephalic.     Right Ear: Tympanic membrane and external ear normal.     Left Ear: Tympanic membrane and external ear normal.  Eyes:     General: Lids are normal.     Pupils: Pupils are equal, round, and reactive to light.  Neck:     Musculoskeletal: Normal range of motion and neck supple.     Vascular: No carotid bruit.  Cardiovascular:     Rate and Rhythm: Normal rate and regular rhythm.     Pulses: Normal pulses.     Heart sounds: Normal heart sounds.  Pulmonary:     Effort: No respiratory distress.     Breath sounds: Normal breath sounds.  Abdominal:     General: Bowel sounds are normal.     Palpations: Abdomen is soft.     Tenderness: There is no abdominal tenderness. There is no guarding.  Musculoskeletal: Normal range of motion.     Comments: There is full range of motion of the right hip, and right knee and right ankle.  There are 2 puncture areas at the right lateral thigh.  Bleeding is controlled.  There are no red streaks appreciated.  No drainage from the site.  The area is not hot to touch.  Dorsalis pedis pulses are 2+.  Lymphadenopathy:     Head:     Right side of head: No submandibular adenopathy.     Left side of head: No submandibular adenopathy.     Cervical: No cervical adenopathy.  Skin:    General: Skin is warm and dry.  Neurological:     Mental Status: She is alert and oriented to person, place,  and time.     Cranial Nerves: No cranial nerve deficit.     Sensory: No sensory deficit.  Psychiatric:        Speech: Speech normal.      ED Treatments / Results  Labs (all labs ordered are listed, but only abnormal results are displayed) Labs Reviewed  POC URINE PREG, ED  EKG None  Radiology No results found.  Procedures Procedures (including critical care time)  Medications Ordered in ED Medications  rabies immune globulin (HYPERAB/KEDRAB) injection 1,650 Units (1,650 Units Intramuscular Given 11/01/18 1750)  rabies vaccine (RABAVERT) injection 1 mL (1 mL Intramuscular Given 11/01/18 1735)  amoxicillin-clavulanate (AUGMENTIN) 875-125 MG per tablet 1 tablet (1 tablet Oral Given 11/01/18 1719)  ondansetron (ZOFRAN) tablet 4 mg (4 mg Oral Given 11/01/18 1720)     Initial Impression / Assessment and Plan / ED Course  I have reviewed the triage vital signs and the nursing notes.  Pertinent labs & imaging results that were available during my care of the patient were reviewed by me and considered in my medical decision making (see chart for details).          Final Clinical Impressions(s) / ED Diagnoses MdM  Patient sustained a cat bite a few days ago.  Patient was notified by animal control that the cat is rabid.  Patient presents now for beginning the series of rabies shots.  The rabies immunoglobulin and the rabies vaccine were initiated in the emergency department today.  Patient had no untoward reactions.  Patient was given information on the dates for her next rabies vaccine.  Questions were answered.  Patient acknowledges understanding of the scheduling of the shots and the need to cleanse the Wound with soap and water.  Prescription for Augmentin given to the patient to use twice a day with food.   Final diagnoses:  Need for rabies vaccination  Cat bite, initial encounter    ED Discharge Orders    None       Lily Kocher, PA-C 11/01/18 1826    Milton Ferguson, MD 11/01/18 [phone]

## 2018-11-01 NOTE — Discharge Instructions (Addendum)
Your initial rabies treatment were started today.  You have been given a list of dates to have the remainder of the rabies treatments done.  Please follow the instructions on your sheet.  Please use Augmentin 2 times daily with food.  This is to prevent infection from the cat bite/scratch.  Please see your primary physician or return to the emergency department if any signs of advancing infection.  Eating a container of yogurt daily may help with preventing gastrointestinal side effects from the antibiotic.

## 2018-11-05 ENCOUNTER — Other Ambulatory Visit: Payer: Self-pay

## 2018-11-05 ENCOUNTER — Ambulatory Visit
Admission: EM | Admit: 2018-11-05 | Discharge: 2018-11-05 | Disposition: A | Discharge status: Home / Self Care | Payer: BC Managed Care – PPO | Attending: Emergency Medicine | Admitting: Emergency Medicine

## 2018-11-05 DIAGNOSIS — Z23 Encounter for immunization: Secondary | ICD-10-CM

## 2018-11-05 DIAGNOSIS — Z203 Contact with and (suspected) exposure to rabies: Secondary | ICD-10-CM | POA: Diagnosis not present

## 2018-11-05 DIAGNOSIS — Z2914 Encounter for prophylactic rabies immune globin: Secondary | ICD-10-CM | POA: Diagnosis not present

## 2018-11-05 MED ORDER — RABIES VACCINE, PCEC IM SUSR
1.0000 mL | Freq: Once | INTRAMUSCULAR | Status: AC
  Administered 2018-11-05: 1 mL via INTRAMUSCULAR

## 2018-11-05 NOTE — ED Triage Notes (Signed)
Pt presents for day 3 rabies vaccine 

## 2019-06-22 ENCOUNTER — Emergency Department (HOSPITAL_COMMUNITY): Payer: BC Managed Care – PPO

## 2019-06-22 ENCOUNTER — Other Ambulatory Visit: Payer: Self-pay

## 2019-06-22 ENCOUNTER — Encounter (HOSPITAL_COMMUNITY): Payer: Self-pay | Admitting: Emergency Medicine

## 2019-06-22 ENCOUNTER — Emergency Department (HOSPITAL_COMMUNITY)
Admission: EM | Admit: 2019-06-22 | Discharge: 2019-06-22 | Disposition: A | Discharge status: Home / Self Care | Payer: BC Managed Care – PPO | Attending: Emergency Medicine | Admitting: Emergency Medicine

## 2019-06-22 DIAGNOSIS — U071 COVID-19: Secondary | ICD-10-CM | POA: Insufficient documentation

## 2019-06-22 DIAGNOSIS — R0789 Other chest pain: Secondary | ICD-10-CM

## 2019-06-22 DIAGNOSIS — Z79899 Other long term (current) drug therapy: Secondary | ICD-10-CM | POA: Diagnosis not present

## 2019-06-22 DIAGNOSIS — J189 Pneumonia, unspecified organism: Secondary | ICD-10-CM

## 2019-06-22 DIAGNOSIS — R739 Hyperglycemia, unspecified: Secondary | ICD-10-CM

## 2019-06-22 LAB — I-STAT BETA HCG BLOOD, ED (MC, WL, AP ONLY): I-stat hCG, quantitative: <5 m[IU]/mL (ref <5)

## 2019-06-22 LAB — CBC
HCT: 46.4 % — ABNORMAL HIGH (ref 36.0–46.0)
Hemoglobin: 14.9 g/dL (ref 12.0–15.0)
MCH: 28.7 pg (ref 26.0–34.0)
MCHC: 32.1 g/dL (ref 30.0–36.0)
MCV: 89.4 fL (ref 80.0–100.0)
Platelets: 175 10*3/uL (ref 150–400)
RBC: 5.19 MIL/uL — ABNORMAL HIGH (ref 3.87–5.11)
RDW: 12 % (ref 11.5–15.5)
WBC: 5 10*3/uL (ref 4.0–10.5)
nRBC: 0 % (ref 0.0–0.2)

## 2019-06-22 LAB — BASIC METABOLIC PANEL
Anion gap: 10 (ref 5–15)
BUN: 11 mg/dL (ref 6–20)
CO2: 22 mmol/L (ref 22–32)
Calcium: 8.2 mg/dL — ABNORMAL LOW (ref 8.9–10.3)
Chloride: 103 mmol/L (ref 98–111)
Creatinine, Ser: 0.75 mg/dL (ref 0.44–1.00)
GFR calc Af Amer: >60 mL/min (ref >60)
GFR calc non Af Amer: >60 mL/min (ref >60)
Glucose, Bld: 220 mg/dL — ABNORMAL HIGH (ref 70–99)
Potassium: 3.9 mmol/L (ref 3.5–5.1)
Sodium: 135 mmol/L (ref 135–145)

## 2019-06-22 LAB — TROPONIN I (HIGH SENSITIVITY)
Troponin I (High Sensitivity): <2 ng/L (ref <18)
Troponin I (High Sensitivity): <2 ng/L (ref <18)

## 2019-06-22 LAB — D-DIMER, QUANTITATIVE: D-Dimer, Quant: 0.59 ug/mL-FEU — ABNORMAL HIGH (ref 0.00–0.50)

## 2019-06-22 MED ORDER — AMOXICILLIN-POT CLAVULANATE 875-125 MG PO TABS: 1.0000 | ORAL_TABLET | Freq: Two times a day (BID) | ORAL | 0 refills | Status: AC

## 2019-06-22 MED ORDER — METFORMIN HCL 500 MG PO TABS: 500.0000 mg | ORAL_TABLET | Freq: Two times a day (BID) | ORAL | 0 refills | Status: DC

## 2019-06-22 MED ORDER — SODIUM CHLORIDE 0.9% FLUSH
3.0000 mL | Freq: Once | INTRAVENOUS | Status: AC
  Administered 2019-06-22: 3 mL via INTRAVENOUS

## 2019-06-22 MED ORDER — DOXYCYCLINE HYCLATE 100 MG PO CAPS: 100.0000 mg | ORAL_CAPSULE | Freq: Two times a day (BID) | ORAL | 0 refills | Status: AC

## 2019-06-22 MED ORDER — SODIUM CHLORIDE 0.9 % IV BOLUS
1000.0000 mL | Freq: Once | INTRAVENOUS | Status: AC
  Administered 2019-06-22: 1000 mL via INTRAVENOUS

## 2019-06-22 MED ORDER — IOHEXOL 350 MG/ML SOLN
100.0000 mL | Freq: Once | INTRAVENOUS | Status: AC | PRN
  Administered 2019-06-22: 64 mL via INTRAVENOUS

## 2019-06-22 MED ORDER — MORPHINE SULFATE (PF) 4 MG/ML IV SOLN
4.0000 mg | Freq: Once | INTRAVENOUS | Status: AC
  Administered 2019-06-22: 4 mg via INTRAVENOUS
  Filled 2019-06-22: qty 1

## 2019-06-22 NOTE — Discharge Instructions (Addendum)
Please take all of your antibiotics until finished!   Take your antibiotics with food.  Common side effects of antibiotics include nausea, vomiting, abdominal discomfort, and diarrhea. You may help offset some of this with probiotics which you can buy or get in yogurt. Do not eat  or take the probiotics until 2 hours after your antibiotic.    Start taking Metformin for your diabetes.  Continue to check your blood sugars at home.  Metformin can have some unpleasant side effects including diarrhea so I typically tell people to taper up on the dose when they start.  This means taking 1/2 tablet once daily for 3 days, then 1 tablet once daily for 3 days, then 1 tablet in the morning and 1/2 tablet in the evening for 3 days, then up to the 1 tablet twice daily.  You can take 1 to 2 tablets of Tylenol (350mg -1000mg  depending on the dose) every 6 hours as needed for pain.  Do not exceed 4000 mg of Tylenol daily.  If your pain persists you can take a doses of ibuprofen in between doses of Tylenol.  I usually recommend 400 to 600 mg of ibuprofen every 6 hours.  Take this with food to avoid upset stomach issues.  Follow-up with a PCP for reevaluation of your symptoms.  If your blood cultures grow anything abnormal you will receive a phone call and will be instructed to come back to the emergency department for IV antibiotics  Your Covid test will result within 24 hours.  You will receive a phone call if your test is positive, no phone call if your test is negative.  If your test is positive you will need to quarantine at home for at least 10 days from when your symptoms began.  Return to the emergency department if any concerning signs or symptoms develop such as high fevers, severe shortness of breath or chest pain, persistent vomiting or loss of consciousness.

## 2019-06-22 NOTE — ED Triage Notes (Signed)
Pt reports R sided chest pain that began this am around 0500 when she was walking to the restroom. She denies pain radiation or sob. Denies cough, fever, chills, or recent sick contacts.

## 2019-06-22 NOTE — ED Notes (Signed)
Pt ambulated without assistance, on steady gait, oxygen keeps in high 90's (98-96%) with no complaints of shortness of breat or weakness.

## 2019-06-22 NOTE — ED Provider Notes (Signed)
[facility] EMERGENCY DEPARTMENT Provider Note   CSN: [id number] Arrival date & time: 06/22/19  1358     History Chief Complaint  Patient presents with  . Chest Pain    Katherine Riddle is a 54 y.o. female with history of diabetes, arthritis presents for evaluation of acute onset, persistent chest pains.  Reports symptoms began upon awakening around 5 AM today.  Reports she went to bed in her usual state of health.  Pain is constant, feels "as though someone punched me in the chest", substernal and radiating to the right side.  She denies any shortness of breath.  It worsens with exertion/ambulation.  Denies fever, cough, abdominal pain, nausea, vomiting, diaphoresis, lightheadedness or syncope.  She is a non-smoker, denies recreational drug use or excessive alcohol intake.  She does report history of diabetes but has not been treated for this in 3 years since her PCP retired.  Denies leg swelling, recent travel or surgeries, hemoptysis, prior history of DVT or PE, and she is not on hormone replacement therapy.  Has not tried anything for her symptoms.  The history is provided by the patient.       Past Medical History:  Diagnosis Date  . Arthritis   . Chronic back pain 2010  . Menorrhagia 04/08/2013    Patient Active Problem List   Diagnosis Date Noted  . Menorrhagia 04/08/2013  . Unspecified internal derangement of knee 09/27/2012  . DJD (degenerative joint disease) of knee 09/27/2012    Past Surgical History:  Procedure Laterality Date  . CESAREAN SECTION    . TUBAL LIGATION       OB History    Gravida  7   Para  7   Term      Preterm      AB      Living  7     SAB      TAB      Ectopic      Multiple      Live Births  7           Family History  Problem Relation Age of Onset  . Arthritis Mother   . Prostate cancer Father   . Hyperlipidemia Father   . Stroke Father   . Diabetes Father   . Early death Neg Hx   .  Hypertension Neg Hx   . Kidney disease Neg Hx     Social History   Tobacco Use  . Smoking status: Never Smoker  . Smokeless tobacco: Never Used  Substance Use Topics  . Alcohol use: No  . Drug use: No    Home Medications Prior to Admission medications   Medication Sig Start Date End Date Taking? Authorizing Provider  amoxicillin-clavulanate (AUGMENTIN) 875-125 MG tablet Take 1 tablet by mouth every 12 (twelve) hours for 5 days. 06/22/19 06/27/19  Michela Pitcher A, PA-C  cyclobenzaprine (FLEXERIL) 10 MG tablet Take 10 mg 3 (three) times daily as needed by mouth for muscle spasms.    [provider]  doxycycline (VIBRAMYCIN) 100 MG capsule Take 1 capsule (100 mg total) by mouth 2 (two) times daily for 5 days. 06/22/19 06/27/19  Michela Pitcher A, PA-C  guaifenesin (ROBITUSSIN) 100 MG/5ML syrup Take 100 mg 3 (three) times daily as needed by mouth for cough.    [provider]  HYDROcodone-acetaminophen (NORCO/VICODIN) 5-325 MG tablet Take 2 tablets every 4 (four) hours as needed by mouth. 02/26/17   Rolland Porter, MD  ibuprofen (  ADVIL,MOTRIN) 200 MG tablet Take 400 mg every 6 (six) hours as needed by mouth for fever.    [provider]  metFORMIN (GLUCOPHAGE) 500 MG tablet Take 1 tablet (500 mg total) by mouth 2 (two) times daily. 06/22/19   Luevenia Maxin, Ethanael Veith A, PA-C  predniSONE (DELTASONE) 20 MG tablet Take 1 tablet (20 mg total) 2 (two) times daily with a meal by mouth. 02/26/17   Rolland Porter, MD  traMADol (ULTRAM) 50 MG tablet Take 50-100 mg every 6 (six) hours as needed by mouth for moderate pain.    [provider]    Allergies    Tramadol  Review of Systems   Review of Systems  Constitutional: Negative for diaphoresis and fever.  Respiratory: Negative for cough and shortness of breath.   Cardiovascular: Positive for chest pain. Negative for leg swelling.  Gastrointestinal: Negative for abdominal pain, nausea and vomiting.  Neurological: Negative for  light-headedness.  All other systems reviewed and are negative.   Physical Exam Updated Vital Signs BP 135/82   Pulse 93   Temp 98.4 F (36.9 C) (Oral)   Resp 19   LMP 02/06/2013   SpO2 96%   Physical Exam Vitals and nursing note reviewed.  Constitutional:      General: She is not in acute distress.    Appearance: She is well-developed.  HENT:     Head: Normocephalic and atraumatic.  Eyes:     General:        Right eye: No discharge.        Left eye: No discharge.     Conjunctiva/sclera: Conjunctivae normal.  Neck:     Vascular: No JVD.     Trachea: No tracheal deviation.  Cardiovascular:     Rate and Rhythm: Tachycardia present.     Pulses:          Radial pulses are 2+ on the right side and 2+ on the left side.       Dorsalis pedis pulses are 2+ on the right side and 2+ on the left side.       Posterior tibial pulses are 2+ on the right side and 2+ on the left side.     Comments: Tachycardic to 115 bpm, Homans sign absent bilaterally, no palpable cords, compartments are soft  Pulmonary:     Effort: Pulmonary effort is normal.     Comments: Globally diminished breath sounds, speaking in full sentences without difficulty, SPO2 saturations 97% on room air Abdominal:     General: There is no distension.     Palpations: Abdomen is soft.     Tenderness: There is no abdominal tenderness.  Musculoskeletal:     Right lower leg: No tenderness. No edema.     Left lower leg: No tenderness. No edema.  Skin:    General: Skin is warm and dry.     Findings: No erythema.  Neurological:     Mental Status: She is alert.  Psychiatric:        Behavior: Behavior normal.     ED Results / Procedures / Treatments   Labs (all labs ordered are listed, but only abnormal results are displayed) Labs Reviewed  BASIC METABOLIC PANEL - Abnormal; Notable for the following components:      Result Value   Glucose, Bld 220 (*)    Calcium 8.2 (*)    All other components within normal  limits  CBC - Abnormal; Notable for the following components:   RBC 5.19 (*)  HCT 46.4 (*)    All other components within normal limits  D-DIMER, QUANTITATIVE (NOT AT [facility]) - Abnormal; Notable for the following components:   D-Dimer, Quant 0.59 (*)    All other components within normal limits  CULTURE, BLOOD (ROUTINE X 2)  CULTURE, BLOOD (ROUTINE X 2)  NOVEL CORONAVIRUS, NAA (HOSP ORDER, SEND-OUT TO REF LAB; TAT 18-24 HRS)  I-STAT BETA HCG BLOOD, ED ([facility], [facility], [facility] ONLY)  TROPONIN I (HIGH SENSITIVITY)  TROPONIN I (HIGH SENSITIVITY)    EKG EKG Interpretation  Date/Time:  Saturday June 22 2019 14:00:34 EST Ventricular Rate:  112 PR Interval:  132 QRS Duration: 74 QT Interval:  330 QTC Calculation: 450 R Axis:   93 Text Interpretation: Sinus tachycardia Rightward axis Borderline ECG No significant change since 11/18 Confirmed by Aletta Edouard [phone]) on 06/22/2019 2:44:06 PM   Radiology DG Chest 2 View  Result Date: 06/22/2019 CLINICAL DATA:  Right-sided chest pain EXAM: CHEST - 2 VIEW COMPARISON:  None. FINDINGS: The heart size and mediastinal contours are within normal limits. Both lungs are clear. The visualized skeletal structures are unremarkable. IMPRESSION: No acute abnormality of the lungs. Electronically Signed   By: Eddie Candle M.D.   On: 06/22/2019 14:44   CT Angio Chest PE W and/or Wo Contrast  Result Date: 06/22/2019 CLINICAL DATA:  PE suspected.  Positive D-dimer. EXAM: CT ANGIOGRAPHY CHEST WITH CONTRAST TECHNIQUE: Multidetector CT imaging of the chest was performed using the standard protocol during bolus administration of intravenous contrast. Multiplanar CT image reconstructions and MIPs were obtained to evaluate the vascular anatomy. CONTRAST:  78mL OMNIPAQUE IOHEXOL 350 MG/ML SOLN COMPARISON:  None. FINDINGS: Cardiovascular: Contrast injection is sufficient to demonstrate satisfactory opacification of the pulmonary arteries to the segmental level. There is no  pulmonary embolus. The main pulmonary artery is within normal limits for size. There is no CT evidence of acute right heart strain. The visualized aorta is normal. Heart size is normal, without pericardial effusion. Mediastinum/Nodes: --there are mildly enlarged mediastinal and hilar lymph nodes bilaterally, presumably reactive. --No axillary lymphadenopathy. --No supraclavicular lymphadenopathy. --Normal thyroid gland. --The esophagus is unremarkable Lungs/Pleura: There is scattered bilateral pulmonary opacities. There is no clear cavitation of any of these pulmonary nodules. There are trace bilateral pleural effusions with adjacent atelectasis. The trachea is unremarkable. There is no pneumothorax. Upper Abdomen: There is hepatic steatosis. Musculoskeletal: No chest wall abnormality. No acute or significant osseous findings. Review of the MIP images confirms the above findings. IMPRESSION: 1. No acute pulmonary embolism. 2. Bilateral pulmonary opacities are noted. Differential considerations include multifocal pneumonia (viral or bacterial) or septic emboli. Correlation with laboratory studies is recommended. 3. Mildly enlarged mediastinal and hilar lymph nodes, likely reactive. Electronically Signed   By: Constance Holster M.D.   On: 06/22/2019 17:11    Procedures Procedures (including critical care time)  Medications Ordered in ED Medications  sodium chloride flush (NS) 0.9 % injection 3 mL (3 mLs Intravenous Given 06/22/19 1617)  sodium chloride 0.9 % bolus 1,000 mL (0 mLs Intravenous Stopped 06/22/19 1914)  morphine 4 MG/ML injection 4 mg (4 mg Intravenous Given 06/22/19 1708)  iohexol (OMNIPAQUE) 350 MG/ML injection 100 mL (64 mLs Intravenous Contrast Given 06/22/19 1700)    ED Course  I have reviewed the triage vital signs and the nursing notes.  Pertinent labs & imaging results that were available during my care of the patient were reviewed by me and considered in my medical decision making  (see chart for details).  MDM Rules/Calculators/A&P                      Katherine Riddle was evaluated in Emergency Department on 06/22/2019 for the symptoms described in the history of present illness. She was evaluated in the context of the global COVID-19 pandemic, which necessitated consideration that the patient might be at risk for infection with the SARS-CoV-2 virus that causes COVID-19. Institutional protocols and algorithms that pertain to the evaluation of patients at risk for COVID-19 are in a state of rapid change based on information released by regulatory bodies including the CDC and federal and state organizations. These policies and algorithms were followed during the patient's care in the ED.  Patient presenting for evaluation of chest pain beginning earlier today.  She is afebrile, initially tachycardic up to the 120s, vital signs otherwise stable.  She denies shortness of breath.  The pain is not pleuritic, may be mildly reproducible on palpation.  Her EKG shows tachycardia but no acute ischemic abnormalities.  Serial troponins are negative and her symptoms are atypical for ACS/MI.  However her D-dimer was mildly elevated so a CTA was obtained which shows no evidence of PE but does show bilateral pulmonary opacities which could be concerning for multifocal pneumonia or septic emboli.  Lab work reviewed and interpreted by myself shows no leukocytosis, no anemia, no metabolic derangements, no renal insufficiency.  Her glucose is elevated but she is not currently undergoing any treatment for her diabetes.  Her anion gap is within normal limits so she likely does not have a lactic acidosis.  In the absence of leukocytosis, fever, or shortness of breath I have a low suspicion of septic pneumonia but blood cultures were obtained.  She was ambulated in the ED with stable SPO2 saturations.  Doubt cardiac tamponade, softgel rupture, pneumothorax, or CHF.  On reevaluation the patient is resting  comfortably in no apparent distress.  She reports that she is feeling better.  She feels comfortable with discharge home.  She is motivated to establish care with a PCP.  Will discharge with course of antibiotics for treatment of pneumonia and will restart her metformin.  We will also obtain an outpatient Covid test.  She understands to return to the ED if she has any worsening signs or symptoms and if her blood cultures are positive she will receive a phone call and be instructed to return to the ED.  Patient verbalized understanding of and agreement with plan and is safe for discharge home at this time.  Discussed with Dr. Jeraldine Loots who agrees with assessment and plan at this time. Final Clinical Impression(s) / ED Diagnoses Final diagnoses:  Atypical chest pain  Multifocal pneumonia  Hyperglycemia    Rx / DC Orders ED Discharge Orders         Ordered    amoxicillin-clavulanate (AUGMENTIN) 875-125 MG tablet  Every 12 hours     06/22/19 1913    doxycycline (VIBRAMYCIN) 100 MG capsule  2 times daily     06/22/19 1913    metFORMIN (GLUCOPHAGE) 500 MG tablet  2 times daily     06/22/19 1913           Bennye Alm 06/22/19 Sharyn Creamer, MD 06/24/19 2129

## 2019-06-25 ENCOUNTER — Telehealth (HOSPITAL_COMMUNITY): Payer: Self-pay

## 2019-06-26 ENCOUNTER — Telehealth: Payer: Self-pay | Admitting: Adult Health

## 2019-06-26 ENCOUNTER — Telehealth: Payer: Self-pay | Admitting: Unknown Physician Specialty

## 2019-06-26 LAB — NOVEL CORONAVIRUS, NAA (HOSP ORDER, SEND-OUT TO REF LAB; TAT 18-24 HRS): SARS-CoV-2, NAA: DETECTED — AB

## 2019-06-26 NOTE — Telephone Encounter (Signed)
Called to discuss with patient about positive SARS-CoV-2, Covid symptoms and the use of bamlanivimab, a monoclonal antibody infusion for those with mild to moderate Covid symptoms and at a high risk of hospitalization.  Pt is qualified for this infusion at the [facility] infusion center due to BMI>35  Per ED noted 2/27 pt has T2D, was re-started on Metformin.  Message left to call back.  MyChart inactive.  William Hamburger, NP-C

## 2019-06-26 NOTE — Telephone Encounter (Signed)
Called to discuss with patient about Covid symptoms and the use of bamlanivimab, a monoclonal antibody infusion for those with mild to moderate Covid symptoms and at a high risk of hospitalization.  Pt is qualified for this infusion at the [facility] infusion center due to BMI>35   Message left to call back  

## 2019-06-27 LAB — CULTURE, BLOOD (ROUTINE X 2)
Culture: NO GROWTH
Culture: NO GROWTH
Special Requests: ADEQUATE

## 2020-08-08 IMAGING — DX DG CHEST 2V
2 series · 2 of 2 positions shown · non-contrast
Comparison: None.

CLINICAL DATA: Right-sided chest pain

EXAM:
CHEST - 2 VIEW

[chest pa]
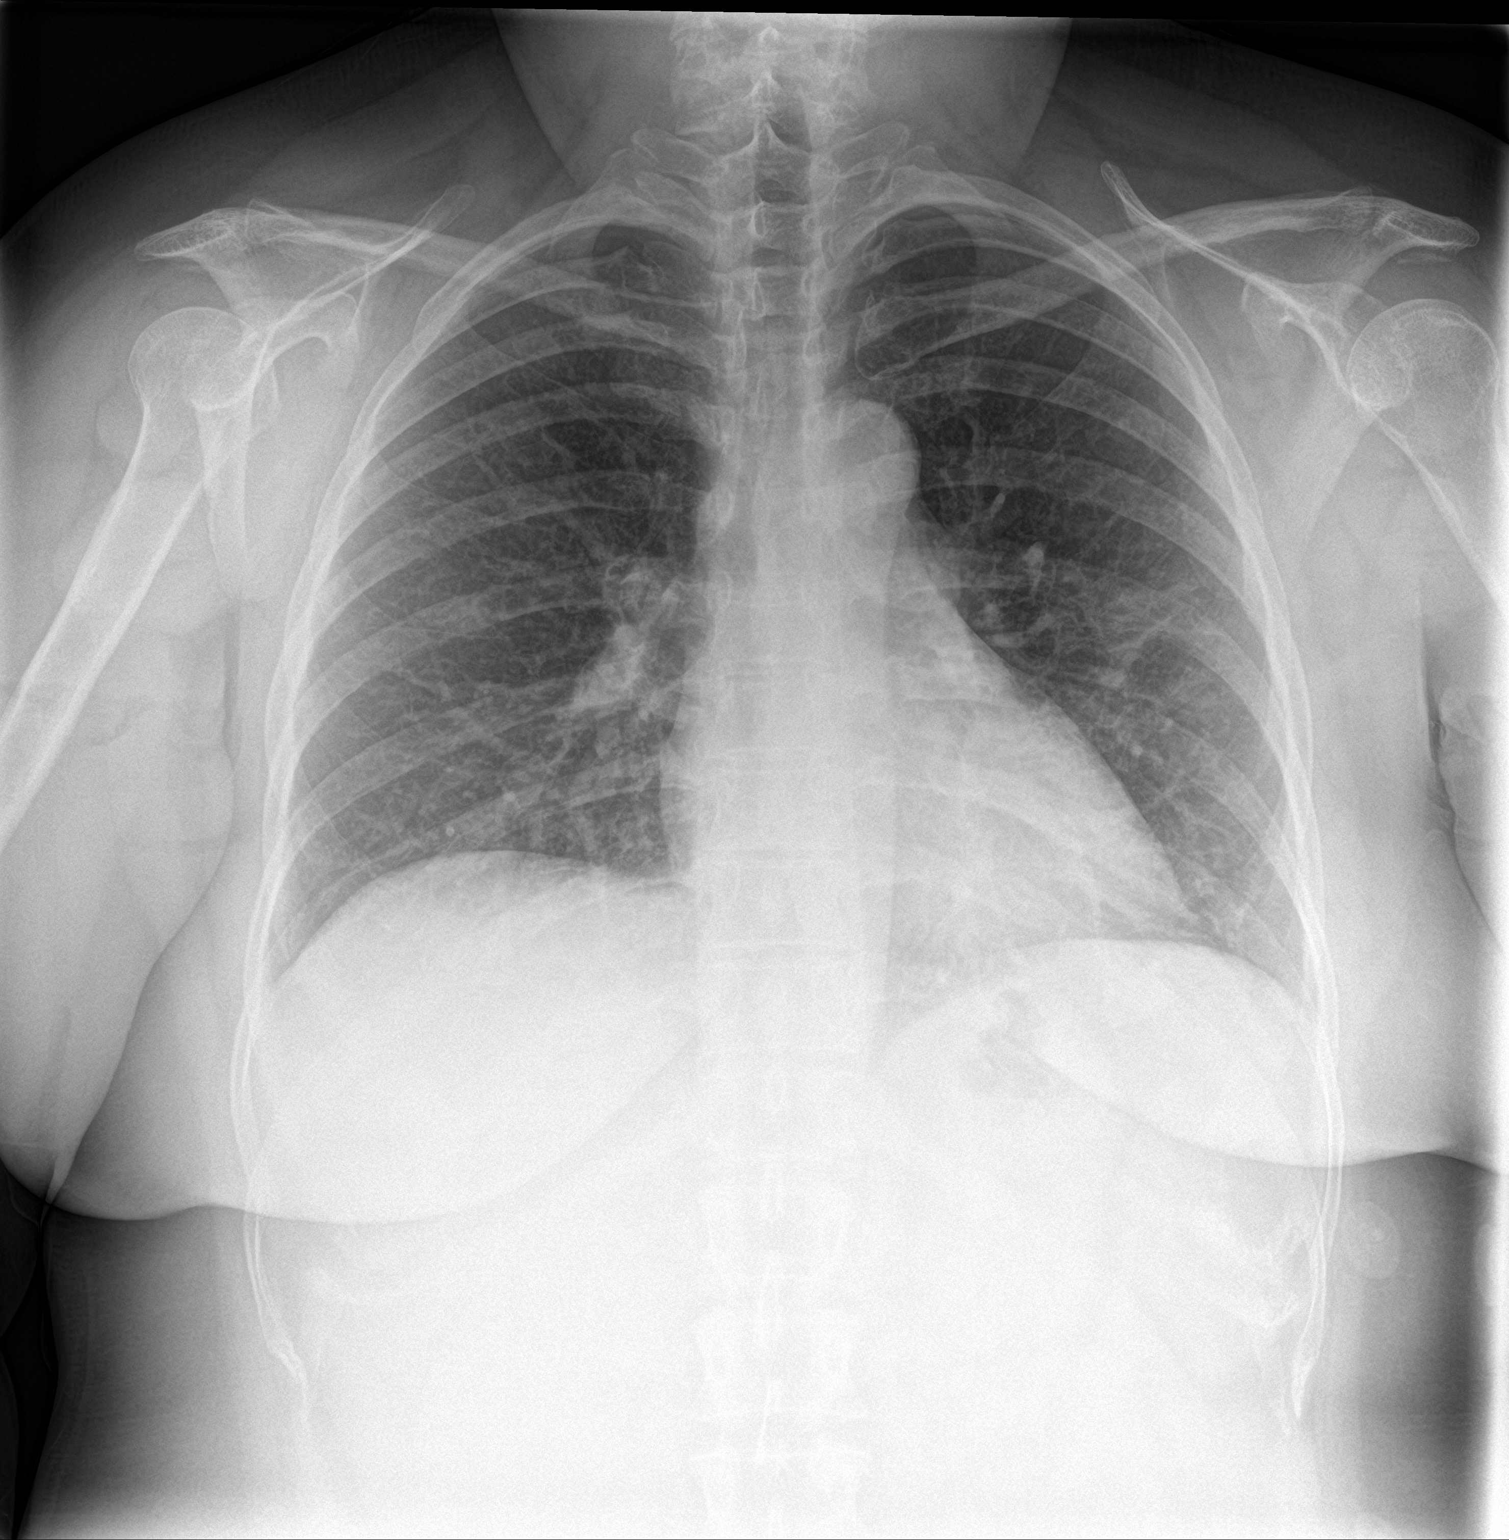

[chest lat]
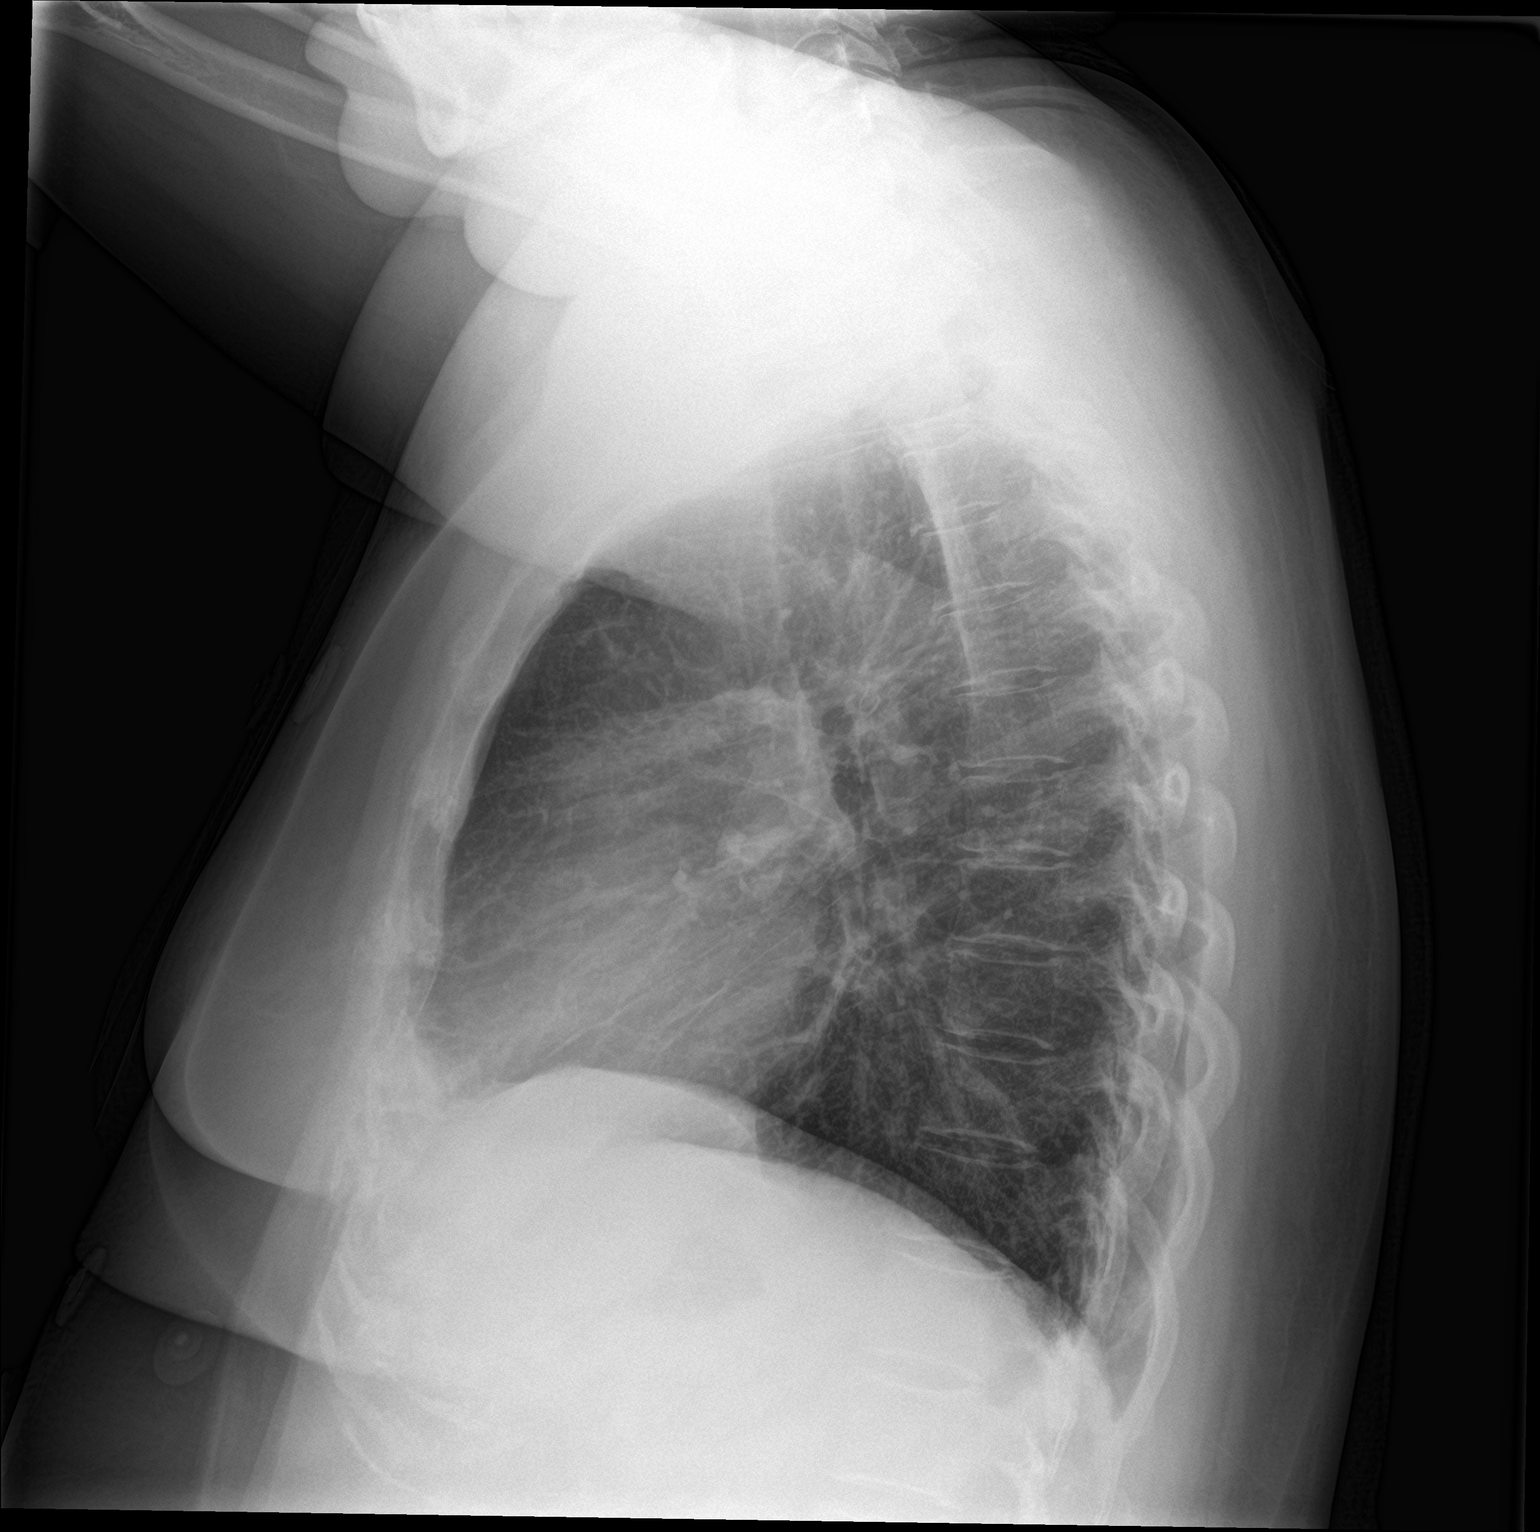

[2 of 2 positions shown; findings below may reference images not displayed]

FINDINGS: The heart size and mediastinal contours are within normal limits.
Both lungs are clear. The visualized skeletal structures are
unremarkable.
IMPRESSION: No acute abnormality of the lungs.

## 2020-08-08 IMAGING — CT CT ANGIO CHEST
2 of 7 series · 18 of 46 positions shown · IV contrast (APPLIED)
Comparison: None.

CLINICAL DATA: PE suspected.  Positive D-dimer.

EXAM:
CT ANGIOGRAPHY CHEST WITH CONTRAST
TECHNIQUE: Multidetector CT imaging of the chest was performed using the
standard protocol during bolus administration of intravenous
contrast. Multiplanar CT image reconstructions and MIPs were
obtained to evaluate the vascular anatomy.
CONTRAST:  64mL OMNIPAQUE IOHEXOL 350 MG/ML SOLN

[Series 7: thins · axial · 0.64mm/px · z∈[-308,-86]mm · 15 of 358 slices shown]
[im 20/358  lung]
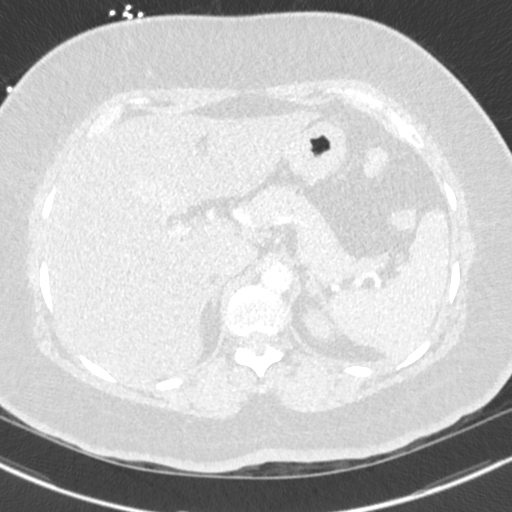
[im 40/358  soft-tissue]
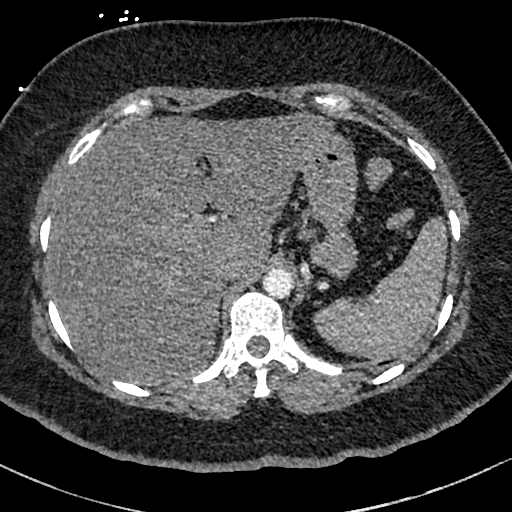
[im 60/358  lung]
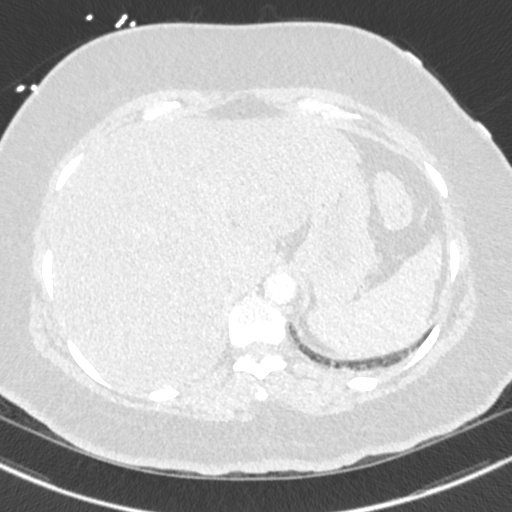
[im 80/358  soft-tissue]
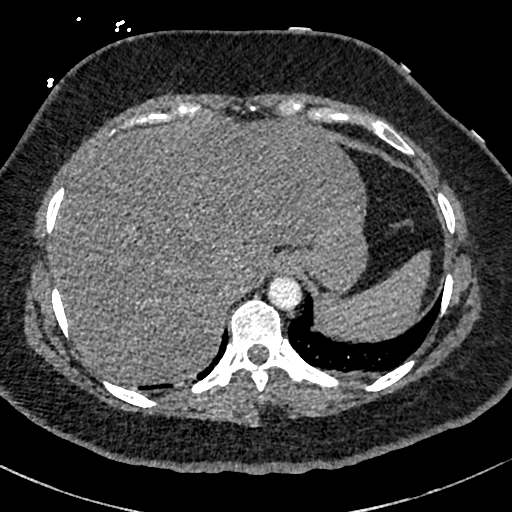
[im 120/358  lung]
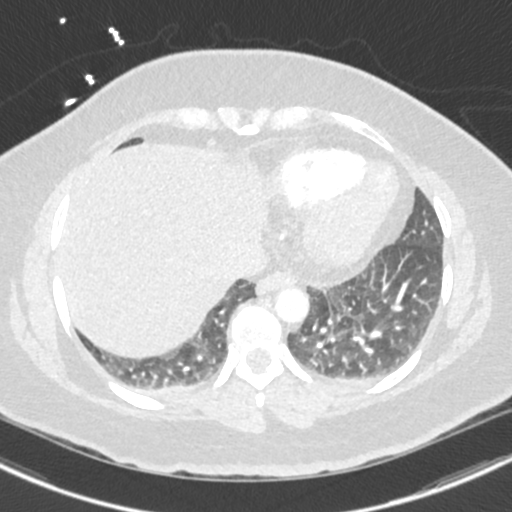
[im 139/358  soft-tissue]
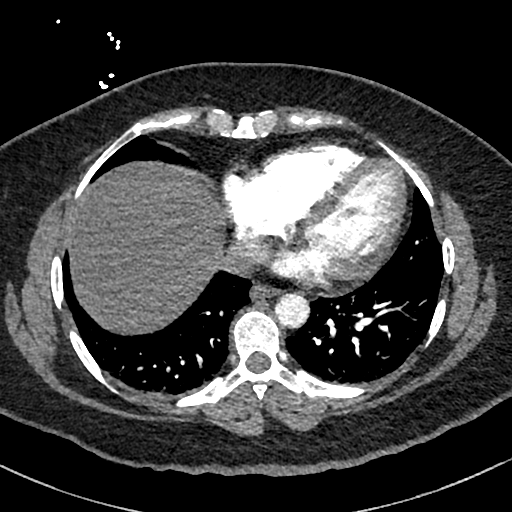
[im 159/358  lung]
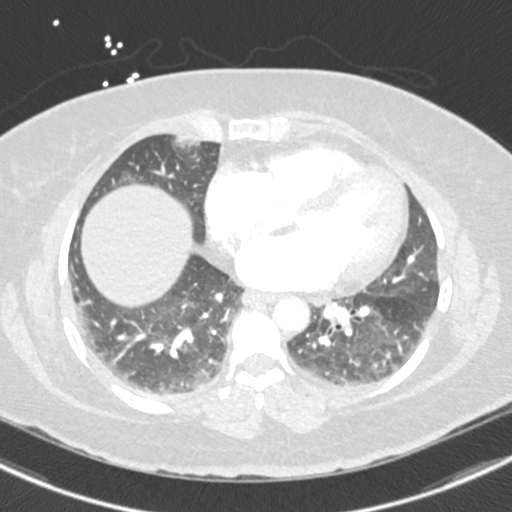
[im 179/358  soft-tissue]
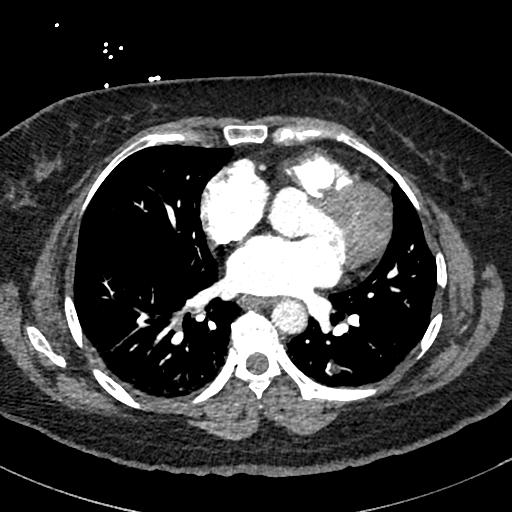
[im 199/358  lung]
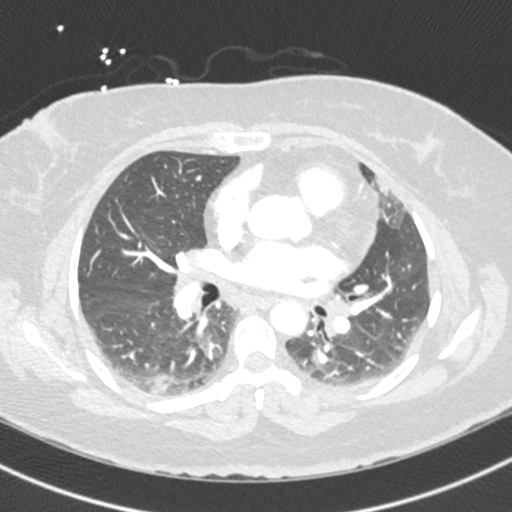
[im 219/358  soft-tissue]
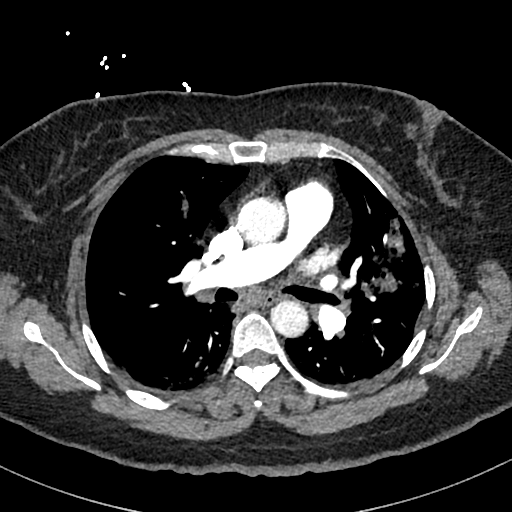
[im 239/358  lung]
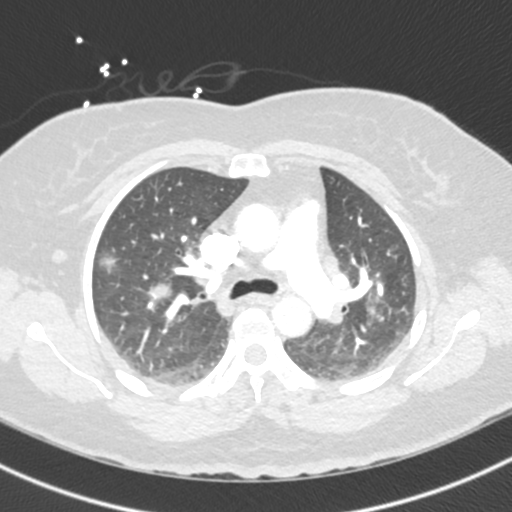
[im 278/358  soft-tissue]
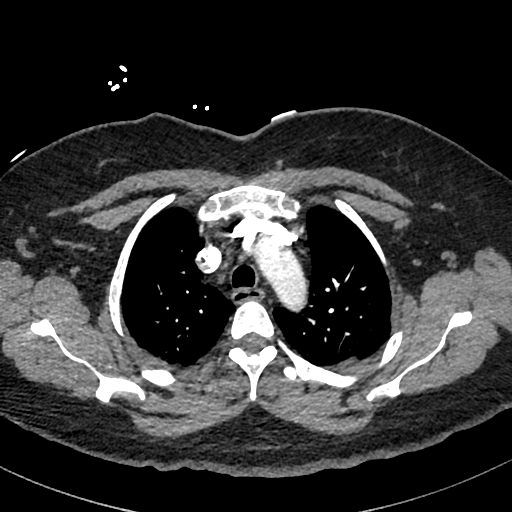
[im 298/358  lung]
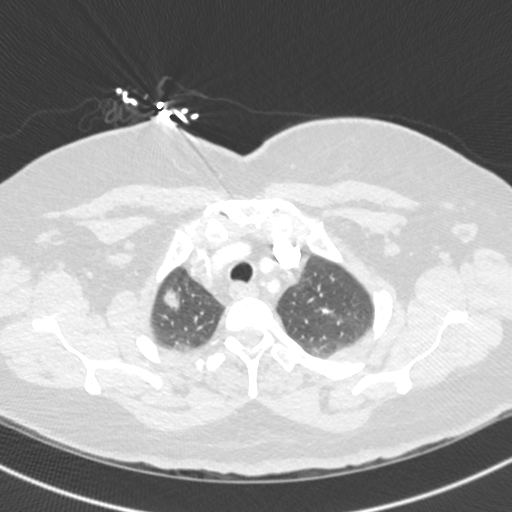
[im 318/358  soft-tissue]
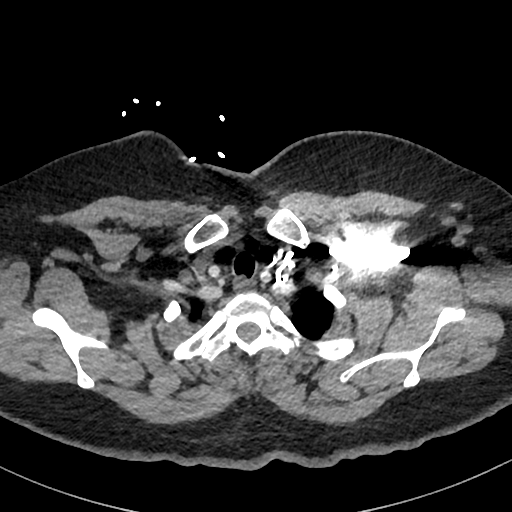
[im 338/358  lung]
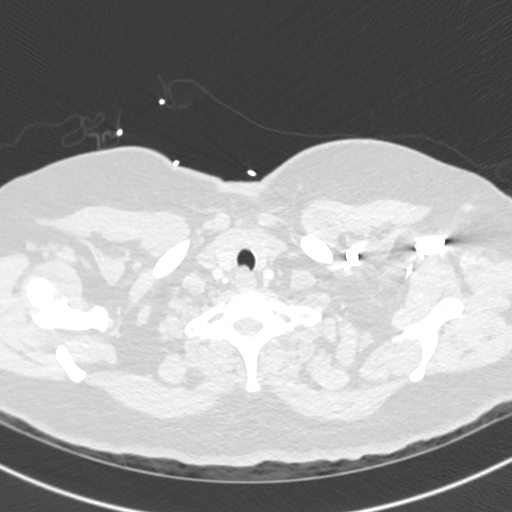

[Series 8: cor · coronal · 0.52mm/px · 3 of 154 slices shown]
[im 39/154  soft-tissue]
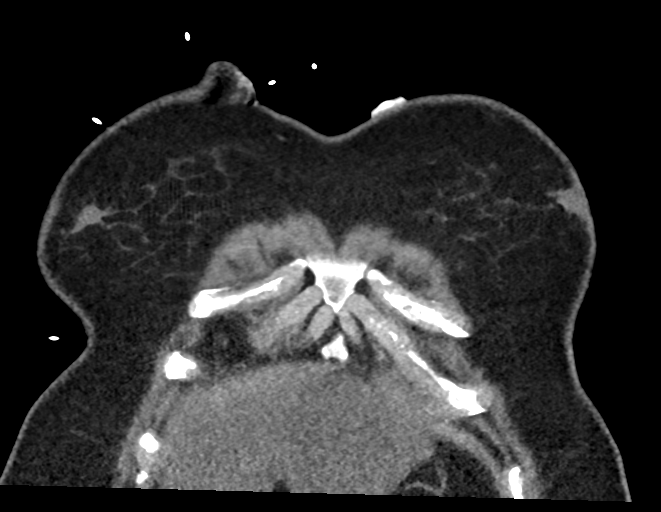
[im 77/154  soft-tissue]
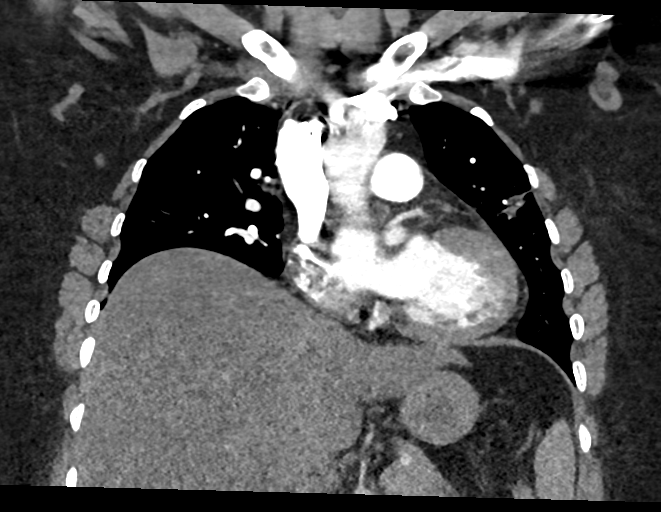
[im 115/154  soft-tissue]
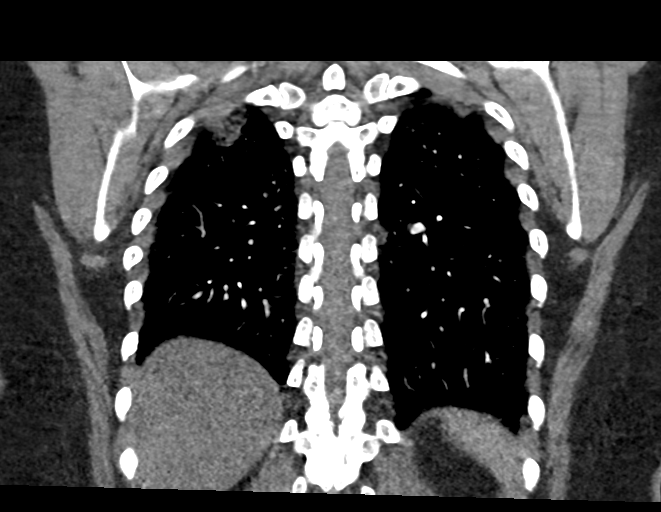

[18 of 46 positions shown; findings below may reference images not displayed]

FINDINGS: Cardiovascular: Contrast injection is sufficient to demonstrate
satisfactory opacification of the pulmonary arteries to the
segmental level. There is no pulmonary embolus. The main pulmonary
artery is within normal limits for size. There is no CT evidence of
acute right heart strain. The visualized aorta is normal. Heart size
is normal, without pericardial effusion.

Mediastinum/Nodes:

--there are mildly enlarged mediastinal and hilar lymph nodes
bilaterally, presumably reactive.

--No axillary lymphadenopathy.

--No supraclavicular lymphadenopathy.

--Normal thyroid gland.

--The esophagus is unremarkable

Lungs/Pleura: There is scattered bilateral pulmonary opacities.
There is no clear cavitation of any of these pulmonary nodules.
There are trace bilateral pleural effusions with adjacent
atelectasis. The trachea is unremarkable. There is no pneumothorax.

Upper Abdomen: There is hepatic steatosis.

Musculoskeletal: No chest wall abnormality. No acute or significant
osseous findings.

Review of the MIP images confirms the above findings.
IMPRESSION: 1. No acute pulmonary embolism.
2. Bilateral pulmonary opacities are noted. Differential
considerations include multifocal pneumonia (viral or bacterial) or
septic emboli. Correlation with laboratory studies is recommended.
3. Mildly enlarged mediastinal and hilar lymph nodes, likely
reactive.

## 2021-12-20 ENCOUNTER — Ambulatory Visit (INDEPENDENT_AMBULATORY_CARE_PROVIDER_SITE_OTHER): Payer: Self-pay

## 2021-12-20 ENCOUNTER — Ambulatory Visit (INDEPENDENT_AMBULATORY_CARE_PROVIDER_SITE_OTHER): Payer: Self-pay | Admitting: Orthopedic Surgery

## 2021-12-20 VITALS — BP 139/89 | HR 96 | Ht 62.0 in | Wt 185.0 lb

## 2021-12-20 DIAGNOSIS — M1711 Unilateral primary osteoarthritis, right knee: Secondary | ICD-10-CM

## 2021-12-20 DIAGNOSIS — G8929 Other chronic pain: Secondary | ICD-10-CM

## 2021-12-20 DIAGNOSIS — M25561 Pain in right knee: Secondary | ICD-10-CM

## 2021-12-20 MED ORDER — METHYLPREDNISOLONE ACETATE 40 MG/ML IJ SUSP
40.0000 mg | Freq: Once | INTRAMUSCULAR | Status: AC
  Administered 2021-12-20: 40 mg via INTRA_ARTICULAR

## 2021-12-20 NOTE — Patient Instructions (Signed)
You have received an injection of steroids into the joint. 15% of patients will have increased pain within the 24 hours postinjection.   This is transient and will go away.   We recommend that you use ice packs on the injection site for 20 minutes every 2 hours and extra strength Tylenol 2 tablets every 8 as needed until the pain resolves.  If you continue to have pain after taking the Tylenol and using the ice please call the office for further instructions.    Please see previous back specialist   If needed ask your primary care doctor to make a referral

## 2021-12-20 NOTE — Addendum Note (Signed)
Addended byCaffie Damme on: 12/20/2021 10:06 AM   Modules accepted: Orders

## 2021-12-20 NOTE — Progress Notes (Signed)
Chief Complaint  Patient presents with   Knee Pain    Right- painful hard to go up and down steps   Current Outpatient Medications  Medication Instructions   glimepiride (AMARYL) 2 mg, Oral, Daily   56 year old female with history of lower back pain and history of surgery being recommended.  However she declined surgery because she felt she could live with it at the time.  She was seen in [place] for that.  She presents now with right knee pain lateral joint line and right leg pain radiating pain right leg.  Patient is diabetic currently not on any medications  BP 139/89   Pulse 96   Ht 5\' 2"  (1.575 m)   Wt 185 lb (83.9 kg)   LMP 02/06/2013   BMI 33.84 kg/m   Constitutional: Vital signs, stable  General appearance normal  Cardiovascular no swelling no varicosities pulses intact temperature normal no edema no tenderness   Skin right and left knee normal   Psych alert and oriented x3, depression anxiety agitation  Musculoskeletal gait  Left knee no swelling no tenderness normal range of motion ligaments are stable muscle tone is normal  Right knee tenderness lateral joint line nontender medial joint line no effusion passive and active range of motion are normal ligaments are stable muscle tone and strength are intact  Imaging of the right knee mild OA right knee   Assessment and plan  Mild OA right knee   Rec inj   Procedure note right knee injection   verbal consent was obtained to inject right knee joint  Timeout was completed to confirm the site of injection  The medications used were depomedrol 40 mg and 1% lidocaine 3 cc Anesthesia was provided by ethyl chloride and the skin was prepped with alcohol.  After cleaning the skin with alcohol a 20-gauge needle was used to inject the right knee joint. There were no complications. A sterile bandage was applied.   See back specialist to re evaluate the back
